# Patient Record
Sex: Female | Born: 2000 | Race: Black or African American | Hispanic: No | Marital: Single | Smoking: Never smoker
Health system: Southern US, Community
[De-identification: ages and names within clinical notes are randomized; demographics above are authoritative.]

## PROBLEM LIST (undated history)

## (undated) DIAGNOSIS — G43909 Migraine, unspecified, not intractable, without status migrainosus: Secondary | ICD-10-CM

## (undated) HISTORY — PX: AXILLARY ABCESS IRRIGATION AND DEBRIDEMENT: SHX1210

---
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
## 2019-06-13 ENCOUNTER — Encounter (HOSPITAL_COMMUNITY): Payer: Self-pay

## 2019-06-13 ENCOUNTER — Emergency Department (HOSPITAL_COMMUNITY)
Admission: EM | Admit: 2019-06-13 | Discharge: 2019-06-13 | Disposition: A | Discharge status: Home / Self Care | Payer: Medicaid Other | Attending: Emergency Medicine | Admitting: Emergency Medicine

## 2019-06-13 ENCOUNTER — Other Ambulatory Visit: Payer: Self-pay

## 2019-06-13 DIAGNOSIS — Z79899 Other long term (current) drug therapy: Secondary | ICD-10-CM | POA: Diagnosis not present

## 2019-06-13 DIAGNOSIS — R1030 Lower abdominal pain, unspecified: Secondary | ICD-10-CM | POA: Diagnosis present

## 2019-06-13 DIAGNOSIS — N898 Other specified noninflammatory disorders of vagina: Secondary | ICD-10-CM | POA: Diagnosis not present

## 2019-06-13 DIAGNOSIS — N739 Female pelvic inflammatory disease, unspecified: Secondary | ICD-10-CM | POA: Diagnosis not present

## 2019-06-13 DIAGNOSIS — N730 Acute parametritis and pelvic cellulitis: Secondary | ICD-10-CM

## 2019-06-13 HISTORY — DX: Migraine, unspecified, not intractable, without status migrainosus: G43.909

## 2019-06-13 LAB — URINALYSIS, ROUTINE W REFLEX MICROSCOPIC
Bilirubin Urine: NEGATIVE
Glucose, UA: NEGATIVE mg/dL
Hgb urine dipstick: NEGATIVE
Ketones, ur: NEGATIVE mg/dL
Nitrite: NEGATIVE
Protein, ur: NEGATIVE mg/dL
Specific Gravity, Urine: 1.011 (ref 1.005–1.030)
pH: 6 (ref 5.0–8.0)

## 2019-06-13 LAB — CBC
HCT: 40.4 % (ref 36.0–46.0)
Hemoglobin: 12.2 g/dL (ref 12.0–15.0)
MCH: 25.8 pg — ABNORMAL LOW (ref 26.0–34.0)
MCHC: 30.2 g/dL (ref 30.0–36.0)
MCV: 85.6 fL (ref 80.0–100.0)
Platelets: 340 10*3/uL (ref 150–400)
RBC: 4.72 MIL/uL (ref 3.87–5.11)
RDW: 14.8 % (ref 11.5–15.5)
WBC: 6.7 10*3/uL (ref 4.0–10.5)
nRBC: 0 % (ref 0.0–0.2)

## 2019-06-13 LAB — WET PREP, GENITAL
Clue Cells Wet Prep HPF POC: NONE SEEN
Sperm: NONE SEEN
Trich, Wet Prep: NONE SEEN
Yeast Wet Prep HPF POC: NONE SEEN

## 2019-06-13 LAB — COMPREHENSIVE METABOLIC PANEL
ALT: 19 U/L (ref 0–44)
AST: 22 U/L (ref 15–41)
Albumin: 4 g/dL (ref 3.5–5.0)
Alkaline Phosphatase: 65 U/L (ref 38–126)
Anion gap: 9 (ref 5–15)
BUN: 10 mg/dL (ref 6–20)
CO2: 23 mmol/L (ref 22–32)
Calcium: 9.4 mg/dL (ref 8.9–10.3)
Chloride: 108 mmol/L (ref 98–111)
Creatinine, Ser: 0.77 mg/dL (ref 0.44–1.00)
GFR calc Af Amer: >60 mL/min (ref >60)
GFR calc non Af Amer: >60 mL/min (ref >60)
Glucose, Bld: 96 mg/dL (ref 70–99)
Potassium: 4.3 mmol/L (ref 3.5–5.1)
Sodium: 140 mmol/L (ref 135–145)
Total Bilirubin: 0.3 mg/dL (ref 0.3–1.2)
Total Protein: 7.9 g/dL (ref 6.5–8.1)

## 2019-06-13 LAB — LIPASE, BLOOD: Lipase: 22 U/L (ref 11–51)

## 2019-06-13 LAB — HIV ANTIBODY (ROUTINE TESTING W REFLEX): HIV Screen 4th Generation wRfx: NONREACTIVE

## 2019-06-13 LAB — I-STAT BETA HCG BLOOD, ED (MC, WL, AP ONLY): I-stat hCG, quantitative: <5 m[IU]/mL (ref <5)

## 2019-06-13 MED ORDER — METRONIDAZOLE 500 MG PO TABS: 500.0000 mg | ORAL_TABLET | Freq: Two times a day (BID) | ORAL | 0 refills | Status: AC

## 2019-06-13 MED ORDER — CEFTRIAXONE SODIUM 1 G IJ SOLR
500.0000 mg | Freq: Once | INTRAMUSCULAR | Status: AC
  Administered 2019-06-13: 500 mg via INTRAMUSCULAR
  Filled 2019-06-13: qty 10

## 2019-06-13 MED ORDER — STERILE WATER FOR INJECTION IJ SOLN
INTRAMUSCULAR | Status: AC
  Administered 2019-06-13: 10 mL
  Filled 2019-06-13: qty 10

## 2019-06-13 MED ORDER — DOXYCYCLINE HYCLATE 100 MG PO CAPS: 100.0000 mg | ORAL_CAPSULE | Freq: Two times a day (BID) | ORAL | 0 refills | Status: DC

## 2019-06-13 MED ORDER — DOXYCYCLINE HYCLATE 100 MG PO TABS
100.0000 mg | ORAL_TABLET | Freq: Once | ORAL | Status: AC
  Administered 2019-06-13: 100 mg via ORAL
  Filled 2019-06-13: qty 1

## 2019-06-13 MED ORDER — SODIUM CHLORIDE 0.9% FLUSH
3.0000 mL | Freq: Once | INTRAVENOUS | Status: AC
  Administered 2019-06-13: 3 mL via INTRAVENOUS

## 2019-06-13 MED ORDER — METRONIDAZOLE 500 MG PO TABS
500.0000 mg | ORAL_TABLET | Freq: Once | ORAL | Status: AC
  Administered 2019-06-13: 500 mg via ORAL
  Filled 2019-06-13: qty 1

## 2019-06-13 NOTE — ED Notes (Signed)
An After Visit Summary was printed and given to the patient. Discharge instructions given and no further questions at this time.  

## 2019-06-13 NOTE — ED Triage Notes (Signed)
Patient c/o RLQ abdominal pain and vaginal discharge x 2 weeks. Patient denies any N/v/d.

## 2019-06-13 NOTE — ED Provider Notes (Signed)
[facility]-EMERGENCY DEPT Provider Note   CSN: [id number] Arrival date & time: 06/13/19  1140     History Chief Complaint  Patient presents with  . Abdominal Pain    Katherine Cross is a 19 y.o. female with no significant past medical history who presents for evaluation of abdominal pain and vaginal discharge.  Patient with diffuse lower abdominal pain. Intermittent LLQ pain however none currently. Has noticed thin, copious, white vaginal discharge.  Intermittent vaginal pruritus however none currently.  She is sexually active and does not use protection.  She is unsure if she could be pregnant.  She is finished her menstrual cycle.  Denies fever, chills, nausea, vomiting, chest pain, shortness of breath, dysuria, diarrhea or constipation.  No prior history of STDs.  Denies additional aggravating or alleviating factors.  Has not take anything for pain. Rates her pain a 7/10. Does not want anything for pain at this time.  History obtained from patient and ast medical records.  No interpreter is used.  HPI     Past Medical History:  Diagnosis Date  . Migraine     There are no problems to display for this patient.   Past Surgical History:  Procedure Laterality Date  . AXILLARY ABCESS IRRIGATION AND DEBRIDEMENT       OB History   No obstetric history on file.     Family History  Problem Relation Age of Onset  . Diabetes Father     Social History   Tobacco Use  . Smoking status: Never Smoker  . Smokeless tobacco: Never Used  Substance Use Topics  . Alcohol use: Yes  . Drug use: Not Currently    Home Medications Prior to Admission medications   Medication Sig Start Date End Date Taking? Authorizing Provider  acetaminophen (TYLENOL) 500 MG tablet Take 1,000 mg by mouth every 6 (six) hours as needed for mild pain or headache.   Yes [provider]  ibuprofen (ADVIL,MOTRIN) 200 MG tablet Take 800 mg by mouth every 6 (six) hours as needed  for headache or moderate pain.   Yes [provider]  JUNEL FE 1/20 1-20 MG-MCG tablet Take 1 tablet by mouth daily. 01/10/18  Yes [provider]  doxycycline (VIBRAMYCIN) 100 MG capsule Take 1 capsule (100 mg total) by mouth 2 (two) times daily. 06/13/19   Nichlos Kunzler A, PA-C  guaiFENesin (ROBITUSSIN) 100 MG/5ML liquid Take 5-10 mLs (100-200 mg total) by mouth every 4 (four) hours as needed for cough. Patient not taking: Reported on 06/13/2019 04/30/18   McDonald, Mia A, PA-C  metroNIDAZOLE (FLAGYL) 500 MG tablet Take 1 tablet (500 mg total) by mouth 2 (two) times daily for 10 days. 06/13/19 06/23/19  Deyja Sochacki A, PA-C  promethazine-dextromethorphan (PROMETHAZINE-DM) 6.25-15 MG/5ML syrup Take 5 mLs by mouth 4 (four) times daily as needed for cough. Patient not taking: Reported on 06/13/2019 04/30/18   Frederik Pear A, PA-C    Allergies    Patient has no known allergies.  Review of Systems   Review of Systems  Constitutional: Negative.   HENT: Negative.   Respiratory: Negative.   Cardiovascular: Negative.   Gastrointestinal: Positive for abdominal pain. Negative for anal bleeding, blood in stool, constipation, diarrhea, nausea, rectal pain and vomiting.  Genitourinary: Positive for pelvic pain and vaginal discharge. Negative for decreased urine volume, difficulty urinating, dysuria, flank pain, frequency, genital sores, hematuria, menstrual problem, urgency, vaginal bleeding and vaginal pain.  Neurological: Negative.   All other systems reviewed  and are negative.   Physical Exam Updated Vital Signs BP 130/87   Pulse 73   Temp 99.3 F (37.4 C) (Oral)   Resp 16   Ht 6' (1.829 m)   Wt 131.5 kg   LMP 05/29/2019   SpO2 100%   BMI 39.33 kg/m   Physical Exam Vitals and nursing note reviewed. Exam conducted with a chaperone present.  Constitutional:      General: She is not in acute distress.    Appearance: She is well-developed. She is obese. She is not  ill-appearing, toxic-appearing or diaphoretic.  HENT:     Head: Atraumatic.     Mouth/Throat:     Mouth: Mucous membranes are moist.  Eyes:     Pupils: Pupils are equal, round, and reactive to light.  Cardiovascular:     Rate and Rhythm: Normal rate.     Heart sounds: Normal heart sounds.  Pulmonary:     Effort: Pulmonary effort is normal. No respiratory distress.  Abdominal:     General: Bowel sounds are normal. There is no distension.     Palpations: Abdomen is soft.     Tenderness: There is abdominal tenderness in the right lower quadrant, suprapubic area and left lower quadrant. There is no right CVA tenderness, left CVA tenderness, guarding or rebound. Negative signs include Murphy's sign and McBurney's sign.     Hernia: No hernia is present.     Comments: Soft, generalized tenderness to lower abd, No rebound or guarding.  Genitourinary:    Comments: Normal appearing external female genitalia without rashes or lesions, normal vaginal epithelium. Normal appearing cervix with moderate thin, light yellow discharge. No cervical petechiae. Cervical os is closed. There is no bleeding noted at the os. mild Odor. Bimanual: CMT tenderness. NO adnexal tenderness. No palpable adnexal masses or tenderness. Uterus midline and not fixed. Rectovaginal exam was deferred.  No cystocele or rectocele noted. No pelvic lymphadenopathy noted. Wet prep was obtained.  Cultures for gonorrhea and chlamydia collected. Exam performed with chaperone in room. Musculoskeletal:        General: Normal range of motion.     Cervical back: Normal range of motion.  Skin:    General: Skin is warm and dry.  Neurological:     Mental Status: She is alert.     ED Results / Procedures / Treatments   Labs (all labs ordered are listed, but only abnormal results are displayed) Labs Reviewed  CBC - Abnormal; Notable for the following components:      Result Value   MCH 25.8 (*)    All other components within normal  limits  URINALYSIS, ROUTINE W REFLEX MICROSCOPIC - Abnormal; Notable for the following components:   Color, Urine YELLOW (*)    APPearance HAZY (*)    Leukocytes,Ua LARGE (*)    Bacteria, UA RARE (*)    All other components within normal limits  WET PREP, GENITAL  URINE CULTURE  LIPASE, BLOOD  COMPREHENSIVE METABOLIC PANEL  HIV ANTIBODY (ROUTINE TESTING W REFLEX)  RPR  I-STAT BETA HCG BLOOD, ED ([facility], [facility], [facility] ONLY)  GC/CHLAMYDIA PROBE AMP ([place]) NOT AT [facility]    EKG None  Radiology No results found.  Procedures Procedures (including critical care time)  Medications Ordered in ED Medications  sodium chloride flush (NS) 0.9 % injection 3 mL (3 mLs Intravenous Given 06/13/19 1321)  cefTRIAXone (ROCEPHIN) injection 500 mg (500 mg Intramuscular Given 06/13/19 1456)  doxycycline (VIBRA-TABS) tablet 100 mg (100 mg Oral  Given 06/13/19 1456)  metroNIDAZOLE (FLAGYL) tablet 500 mg (500 mg Oral Given 06/13/19 1456)  sterile water (preservative free) injection (10 mLs  Given 06/13/19 1456)    ED Course  I have reviewed the triage vital signs and the nursing notes.  Pertinent labs & imaging results that were available during my care of the patient were reviewed by me and considered in my medical decision making (see chart for details).  19 year old female appears otherwise well presents for evaluation of lower abdominal pain vaginal discharge.  No prior STDs.  Diffuse lower abdominal pain. Heart and lungs clear.  She is afebrile, nonseptic, non-ill-appearing. Plan for labs, imaging, pelvic exam and reassess.  Labs and imaging personally reviewed and interpreted.  No leukocytosis.  GU exam with moderate discharge in vault.  There is cervical motion tenderness however no adnexal tenderness.  Low suspicion for torsion, TOA.  High clinical suspicion for PID.  Will treat as such.  Patient would like to hold on imaging at this time which I feel is reasonable.  She does not appear septic.  Triage  note had noted right lower quadrant pain however patient denies this.  Low suspicion for appendicitis.  Patient is nontoxic, nonseptic appearing, in no apparent distress.  Patient's pain and other symptoms adequately managed in emergency department.  Fluid bolus given.  Labs, imaging and vitals reviewed.  Patient does not meet the SIRS or Sepsis criteria.  On repeat exam patient does not have a surgical abdomin and there are no peritoneal signs.  No indication of appendicitis, bowel obstruction, bowel perforation, cholecystitis, diverticulitis, TOA, torsion or ectopic pregnancy.  Patient discharged home with symptomatic treatment and given strict instructions for follow-up with their primary care physician.  I have also discussed reasons to return immediately to the ER.  Patient expresses understanding and agrees with plan.    MDM Rules/Calculators/A&P                       Final Clinical Impression(s) / ED Diagnoses Final diagnoses:  PID (acute pelvic inflammatory disease)    Rx / DC Orders ED Discharge Orders         Ordered    doxycycline (VIBRAMYCIN) 100 MG capsule  2 times daily     06/13/19 1440    metroNIDAZOLE (FLAGYL) 500 MG tablet  2 times daily     06/13/19 1440           Sagan Maselli A, PA-C 06/13/19 1457    Pattricia Boss, MD 06/14/19 1137

## 2019-06-13 NOTE — Discharge Instructions (Signed)
Take the antibiotics as prescribed. Return for new or worsening symptoms. °

## 2019-06-14 LAB — URINE CULTURE

## 2019-06-14 LAB — RPR: RPR Ser Ql: NONREACTIVE

## 2019-06-15 LAB — GC/CHLAMYDIA PROBE AMP (~~LOC~~) NOT AT ARMC
Chlamydia: POSITIVE — AB
Neisseria Gonorrhea: NEGATIVE

## 2019-07-04 ENCOUNTER — Emergency Department (HOSPITAL_COMMUNITY)
Admission: EM | Admit: 2019-07-04 | Discharge: 2019-07-04 | Disposition: A | Discharge status: Home / Self Care | Payer: Medicaid Other | Attending: Emergency Medicine | Admitting: Emergency Medicine

## 2019-07-04 ENCOUNTER — Emergency Department (HOSPITAL_COMMUNITY): Payer: Medicaid Other

## 2019-07-04 ENCOUNTER — Encounter (HOSPITAL_COMMUNITY): Payer: Self-pay

## 2019-07-04 ENCOUNTER — Other Ambulatory Visit: Payer: Self-pay

## 2019-07-04 DIAGNOSIS — N898 Other specified noninflammatory disorders of vagina: Secondary | ICD-10-CM | POA: Diagnosis present

## 2019-07-04 DIAGNOSIS — N730 Acute parametritis and pelvic cellulitis: Secondary | ICD-10-CM

## 2019-07-04 DIAGNOSIS — N739 Female pelvic inflammatory disease, unspecified: Secondary | ICD-10-CM | POA: Insufficient documentation

## 2019-07-04 DIAGNOSIS — Z79899 Other long term (current) drug therapy: Secondary | ICD-10-CM | POA: Insufficient documentation

## 2019-07-04 LAB — COMPREHENSIVE METABOLIC PANEL
ALT: 22 U/L (ref 0–44)
AST: 23 U/L (ref 15–41)
Albumin: 4 g/dL (ref 3.5–5.0)
Alkaline Phosphatase: 58 U/L (ref 38–126)
Anion gap: 11 (ref 5–15)
BUN: 9 mg/dL (ref 6–20)
CO2: 22 mmol/L (ref 22–32)
Calcium: 9.2 mg/dL (ref 8.9–10.3)
Chloride: 109 mmol/L (ref 98–111)
Creatinine, Ser: 0.86 mg/dL (ref 0.44–1.00)
GFR calc Af Amer: >60 mL/min (ref >60)
GFR calc non Af Amer: >60 mL/min (ref >60)
Glucose, Bld: 114 mg/dL — ABNORMAL HIGH (ref 70–99)
Potassium: 3.9 mmol/L (ref 3.5–5.1)
Sodium: 142 mmol/L (ref 135–145)
Total Bilirubin: 0.4 mg/dL (ref 0.3–1.2)
Total Protein: 8.2 g/dL — ABNORMAL HIGH (ref 6.5–8.1)

## 2019-07-04 LAB — I-STAT BETA HCG BLOOD, ED (MC, WL, AP ONLY): I-stat hCG, quantitative: <5 m[IU]/mL (ref <5)

## 2019-07-04 LAB — CBC WITH DIFFERENTIAL/PLATELET
Abs Immature Granulocytes: 0.01 10*3/uL (ref 0.00–0.07)
Basophils Absolute: 0.1 10*3/uL (ref 0.0–0.1)
Basophils Relative: 1 %
Eosinophils Absolute: 0.1 10*3/uL (ref 0.0–0.5)
Eosinophils Relative: 2 %
HCT: 41.5 % (ref 36.0–46.0)
Hemoglobin: 12.8 g/dL (ref 12.0–15.0)
Immature Granulocytes: 0 %
Lymphocytes Relative: 28 %
Lymphs Abs: 1.5 10*3/uL (ref 0.7–4.0)
MCH: 26 pg (ref 26.0–34.0)
MCHC: 30.8 g/dL (ref 30.0–36.0)
MCV: 84.2 fL (ref 80.0–100.0)
Monocytes Absolute: 0.2 10*3/uL (ref 0.1–1.0)
Monocytes Relative: 4 %
Neutro Abs: 3.5 10*3/uL (ref 1.7–7.7)
Neutrophils Relative %: 65 %
Platelets: 328 10*3/uL (ref 150–400)
RBC: 4.93 MIL/uL (ref 3.87–5.11)
RDW: 15.3 % (ref 11.5–15.5)
WBC: 5.4 10*3/uL (ref 4.0–10.5)
nRBC: 0 % (ref 0.0–0.2)

## 2019-07-04 LAB — URINALYSIS, ROUTINE W REFLEX MICROSCOPIC
Bilirubin Urine: NEGATIVE
Glucose, UA: NEGATIVE mg/dL
Ketones, ur: NEGATIVE mg/dL
Nitrite: NEGATIVE
Protein, ur: 100 mg/dL — AB
RBC / HPF: >50 RBC/hpf — ABNORMAL HIGH (ref 0–5)
Specific Gravity, Urine: 1.028 (ref 1.005–1.030)
WBC, UA: >50 WBC/hpf — ABNORMAL HIGH (ref 0–5)
pH: 6 (ref 5.0–8.0)

## 2019-07-04 LAB — LIPASE, BLOOD: Lipase: 21 U/L (ref 11–51)

## 2019-07-04 LAB — WET PREP, GENITAL
Clue Cells Wet Prep HPF POC: NONE SEEN
Sperm: NONE SEEN
Trich, Wet Prep: NONE SEEN
Yeast Wet Prep HPF POC: NONE SEEN

## 2019-07-04 LAB — HIV ANTIBODY (ROUTINE TESTING W REFLEX): HIV Screen 4th Generation wRfx: NONREACTIVE

## 2019-07-04 MED ORDER — LIDOCAINE HCL (PF) 1 % IJ SOLN
2.0000 mL | Freq: Once | INTRAMUSCULAR | Status: AC
  Administered 2019-07-04: 2 mL
  Filled 2019-07-04: qty 30

## 2019-07-04 MED ORDER — METRONIDAZOLE 500 MG PO TABS: 500.0000 mg | ORAL_TABLET | Freq: Two times a day (BID) | ORAL | 0 refills | Status: AC

## 2019-07-04 MED ORDER — KETOROLAC TROMETHAMINE 60 MG/2ML IM SOLN
30.0000 mg | Freq: Once | INTRAMUSCULAR | Status: AC
  Administered 2019-07-04: 30 mg via INTRAMUSCULAR
  Filled 2019-07-04: qty 2

## 2019-07-04 MED ORDER — CEFTRIAXONE SODIUM 1 G IJ SOLR
500.0000 mg | Freq: Once | INTRAMUSCULAR | Status: AC
  Administered 2019-07-04: 500 mg via INTRAMUSCULAR
  Filled 2019-07-04: qty 10

## 2019-07-04 MED ORDER — IOHEXOL 300 MG/ML  SOLN
100.0000 mL | Freq: Once | INTRAMUSCULAR | Status: AC | PRN
  Administered 2019-07-04: 100 mL via INTRAVENOUS

## 2019-07-04 MED ORDER — SODIUM CHLORIDE (PF) 0.9 % IJ SOLN
INTRAMUSCULAR | Status: AC
  Filled 2019-07-04: qty 50

## 2019-07-04 MED ORDER — DOXYCYCLINE HYCLATE 100 MG PO CAPS: 100.0000 mg | ORAL_CAPSULE | Freq: Two times a day (BID) | ORAL | 0 refills | Status: AC

## 2019-07-04 NOTE — ED Notes (Signed)
Pt ambulatory to bathroom, no assistance needed.  

## 2019-07-04 NOTE — Discharge Instructions (Addendum)
[facility] will be contacting you to schedule an appointment to be seen either this Wednesday or Thursday. If you have not heard from them by Thursday I would recommend calling to schedule an appointment for your pelvic inflammatory disease.   Pick up medications and take as prescribed. It is very important to take the entire course of the medication. Do not have intercourse while you are on this medication. Do not drink alcohol while on this medication.   You may take Ibuprofen and Tylenol as needed for pain.   Return to the ED IMMEDIATELY for any worsening symptoms including fevers > 100.4/chills, worsening pain, vomiting, passing out, or any other concerning symptoms.

## 2019-07-04 NOTE — ED Provider Notes (Signed)
[facility]-EMERGENCY DEPT Provider Note   CSN: [id number] Arrival date & time: 07/04/19  1027     History Chief Complaint  Patient presents with  . Vaginal Discharge    Katherine Cross is a 19 y.o. female who presents to the ED today with complaint of return of vaginal discharge and odor x 4-5 days. Pt also complains of abdominal pain. Per chart review pt was seen in the ED on 03/15 for similar complaint and diagnosed with PID. She declined imaging at that time and there was less concern for TOA vs torsion. Pt discharged home with doxycycline and flagyl. She reports taking all of her medications and denies having intercourse since then. She states that the vaginal discharge improved however returned 4 days ago. Pt reports this feel similar to last time. She is about to start her menses today. She has not taken anything for pain. Denies fevers, chills, nausea, vomiting, constipation, flank pain, urinary sx.   The history is provided by the patient and medical records.       Past Medical History:  Diagnosis Date  . Migraine     There are no problems to display for this patient.   Past Surgical History:  Procedure Laterality Date  . AXILLARY ABCESS IRRIGATION AND DEBRIDEMENT       OB History   No obstetric history on file.     Family History  Problem Relation Age of Onset  . Diabetes Father     Social History   Tobacco Use  . Smoking status: Never Smoker  . Smokeless tobacco: Never Used  Substance Use Topics  . Alcohol use: Yes  . Drug use: Not Currently    Home Medications Prior to Admission medications   Medication Sig Start Date End Date Taking? Authorizing Provider  acetaminophen (TYLENOL) 500 MG tablet Take 1,000 mg by mouth every 6 (six) hours as needed for mild pain or headache.   Yes [provider]  ibuprofen (ADVIL,MOTRIN) 200 MG tablet Take 800 mg by mouth every 6 (six) hours as needed for headache or moderate pain.   Yes  [provider]  JUNEL FE 1/20 1-20 MG-MCG tablet Take 1 tablet by mouth daily. 01/10/18  Yes [provider]  doxycycline (VIBRAMYCIN) 100 MG capsule Take 1 capsule (100 mg total) by mouth 2 (two) times daily for 14 days. 07/04/19 07/18/19  Tanda RockersVenter, Delois Silvester, PA-C  guaiFENesin (ROBITUSSIN) 100 MG/5ML liquid Take 5-10 mLs (100-200 mg total) by mouth every 4 (four) hours as needed for cough. Patient not taking: Reported on 06/13/2019 04/30/18   McDonald, Mia A, PA-C  metroNIDAZOLE (FLAGYL) 500 MG tablet Take 1 tablet (500 mg total) by mouth 2 (two) times daily for 14 days. 07/04/19 07/18/19  Tanda RockersVenter, Dimond Crotty, PA-C  promethazine-dextromethorphan (PROMETHAZINE-DM) 6.25-15 MG/5ML syrup Take 5 mLs by mouth 4 (four) times daily as needed for cough. Patient not taking: Reported on 06/13/2019 04/30/18   Frederik PearMcDonald, Mia A, PA-C    Allergies    Patient has no known allergies.  Review of Systems   Review of Systems  Constitutional: Negative for chills and fever.  Gastrointestinal: Positive for abdominal pain. Negative for constipation, nausea and vomiting.  Genitourinary: Positive for pelvic pain and vaginal discharge. Negative for dysuria, flank pain, frequency and menstrual problem.  All other systems reviewed and are negative.   Physical Exam Updated Vital Signs BP (!) 137/116 (BP Location: Left Arm)   Pulse 89   Temp 98.5 F (36.9 C) (Oral)  Resp 16   LMP 06/04/2019 (Approximate)   SpO2 94%   Physical Exam Vitals and nursing note reviewed.  Constitutional:      Appearance: She is obese. She is not ill-appearing or diaphoretic.  HENT:     Head: Normocephalic and atraumatic.  Eyes:     Conjunctiva/sclera: Conjunctivae normal.  Cardiovascular:     Rate and Rhythm: Normal rate and regular rhythm.     Pulses: Normal pulses.  Pulmonary:     Effort: Pulmonary effort is normal.     Breath sounds: Normal breath sounds. No wheezing, rhonchi or rales.  Abdominal:     Palpations:  Abdomen is soft.     Tenderness: There is abdominal tenderness. There is no right CVA tenderness, left CVA tenderness, guarding or rebound.     Comments: Soft, + diffuse abdominal TTP however > LUQ, +BS throughout, no r/g/r, neg murphy's, neg mcburney's, no CVA TTP  Genitourinary:    Comments: Chaperone present for exam.  Exam very limited due to body habitus as well as excessive vaginal TTP. Small amount of blood in vault as well as thin yellow discharge. + exquisite TTP to vaginal area upon inserting speculum even 0.5 cm. Musculoskeletal:     Cervical back: Neck supple.  Skin:    General: Skin is warm and dry.  Neurological:     Mental Status: She is alert.     ED Results / Procedures / Treatments   Labs (all labs ordered are listed, but only abnormal results are displayed) Labs Reviewed  WET PREP, GENITAL - Abnormal; Notable for the following components:      Result Value   WBC, Wet Prep HPF POC MANY (*)    All other components within normal limits  URINALYSIS, ROUTINE W REFLEX MICROSCOPIC - Abnormal; Notable for the following components:   Color, Urine AMBER (*)    APPearance HAZY (*)    Hgb urine dipstick LARGE (*)    Protein, ur 100 (*)    Leukocytes,Ua SMALL (*)    RBC / HPF >50 (*)    WBC, UA >50 (*)    Bacteria, UA FEW (*)    All other components within normal limits  COMPREHENSIVE METABOLIC PANEL - Abnormal; Notable for the following components:   Glucose, Bld 114 (*)    Total Protein 8.2 (*)    All other components within normal limits  URINE CULTURE  HIV ANTIBODY (ROUTINE TESTING W REFLEX)  LIPASE, BLOOD  CBC WITH DIFFERENTIAL/PLATELET  RPR  I-STAT BETA HCG BLOOD, ED ([facility], [facility], [facility] ONLY)  GC/CHLAMYDIA PROBE AMP ([facility]) NOT AT [facility]    EKG None  Radiology US Transvaginal Non-OB  Result Date: 07/04/2019 CLINICAL DATA:  Pelvic pain and cramping with vaginal discharge EXAM: TRANSABDOMINAL AND TRANSVAGINAL ULTRASOUND OF PELVIS DOPPLER ULTRASOUND OF OVARIES  TECHNIQUE: Study was performed transabdominally to optimize pelvic field of view evaluation and transvaginally to optimize internal visceral architecture evaluation. Color and duplex Doppler ultrasound was utilized to evaluate blood flow to the ovaries. COMPARISON:  None. FINDINGS: Uterus Measurements: 7.3 x 3.8 x 4.2 cm = volume: 61.2 mL. No fibroids or other mass visualized. Endometrium Thickness: 4 mm.  No focal abnormality visualized. Right ovary Measurements: 3.2 x 2.3 x 2.6 cm = volume: 10.2 mL. Normal appearance/no adnexal mass. Left ovary Measurements: 2.9 x 2.0 x 1.9 cm = volume: 5.6 mL. Normal appearance/no adnexal mass. Pulsed Doppler evaluation of both ovaries demonstrates normal low-resistance arterial and venous waveforms. Other findings Trace fluid in  cul-de-sac. IMPRESSION: Patient is tender over the right ovary with transvaginal technique. Significance of this tenderness is uncertain. No ovarian/adnexal mass evident on either side. Low resistance waveform in each ovary. No demonstrable ovarian torsion. Uterus and endometrium appear unremarkable. Trace free pelvic fluid may be physiologic. Electronically Signed   By: Bretta Bang III M.D.   On: 07/04/2019 13:03   US Pelvis Complete  Result Date: 07/04/2019 CLINICAL DATA:  Pelvic pain and cramping with vaginal discharge EXAM: TRANSABDOMINAL AND TRANSVAGINAL ULTRASOUND OF PELVIS DOPPLER ULTRASOUND OF OVARIES TECHNIQUE: Study was performed transabdominally to optimize pelvic field of view evaluation and transvaginally to optimize internal visceral architecture evaluation. Color and duplex Doppler ultrasound was utilized to evaluate blood flow to the ovaries. COMPARISON:  None. FINDINGS: Uterus Measurements: 7.3 x 3.8 x 4.2 cm = volume: 61.2 mL. No fibroids or other mass visualized. Endometrium Thickness: 4 mm.  No focal abnormality visualized. Right ovary Measurements: 3.2 x 2.3 x 2.6 cm = volume: 10.2 mL. Normal appearance/no adnexal mass. Left  ovary Measurements: 2.9 x 2.0 x 1.9 cm = volume: 5.6 mL. Normal appearance/no adnexal mass. Pulsed Doppler evaluation of both ovaries demonstrates normal low-resistance arterial and venous waveforms. Other findings Trace fluid in cul-de-sac. IMPRESSION: Patient is tender over the right ovary with transvaginal technique. Significance of this tenderness is uncertain. No ovarian/adnexal mass evident on either side. Low resistance waveform in each ovary. No demonstrable ovarian torsion. Uterus and endometrium appear unremarkable. Trace free pelvic fluid may be physiologic. Electronically Signed   By: Bretta Bang III M.D.   On: 07/04/2019 13:03   CT Abdomen Pelvis W Contrast  Result Date: 07/04/2019 CLINICAL DATA:  Vaginal discharge, lower abdominal pain, recently treated for PID EXAM: CT ABDOMEN AND PELVIS WITH CONTRAST TECHNIQUE: Multidetector CT imaging of the abdomen and pelvis was performed using the standard protocol following bolus administration of intravenous contrast. CONTRAST:  OMNIPAQUE IOHEXOL 300 MG/ML  SOLN COMPARISON:  Pelvic ultrasound, 07/04/2019 FINDINGS: Lower chest: No acute abnormality. Hepatobiliary: No solid liver abnormality is seen. No gallstones, gallbladder wall thickening, or biliary dilatation. Pancreas: Unremarkable. No pancreatic ductal dilatation or surrounding inflammatory changes. Spleen: Normal in size without significant abnormality. Adrenals/Urinary Tract: Adrenal glands are unremarkable. Kidneys are normal, without renal calculi, solid lesion, or hydronephrosis. Bladder is unremarkable. Stomach/Bowel: Stomach is within normal limits. Appendix appears normal. No evidence of bowel wall thickening, distention, or inflammatory changes. Vascular/Lymphatic: No significant vascular findings are present. No enlarged abdominal or pelvic lymph nodes. Reproductive: No mass or other significant abnormality. Other: No abdominal wall hernia or abnormality. Trace ascites in the low  pelvis. Musculoskeletal: No acute or significant osseous findings. IMPRESSION: Nonspecific trace ascites in the low pelvis, as seen on same-day ultrasound, and may be functional or reactive. No specific findings of the abdomen or pelvis to explain lower abdominal pain. Electronically Signed   By: Lauralyn Primes M.D.   On: 07/04/2019 14:42   [place] Art/Ven Flow Abd Pelv Doppler  Result Date: 07/04/2019 CLINICAL DATA:  Pelvic pain and cramping with vaginal discharge EXAM: TRANSABDOMINAL AND TRANSVAGINAL ULTRASOUND OF PELVIS DOPPLER ULTRASOUND OF OVARIES TECHNIQUE: Study was performed transabdominally to optimize pelvic field of view evaluation and transvaginally to optimize internal visceral architecture evaluation. Color and duplex Doppler ultrasound was utilized to evaluate blood flow to the ovaries. COMPARISON:  None. FINDINGS: Uterus Measurements: 7.3 x 3.8 x 4.2 cm = volume: 61.2 mL. No fibroids or other mass visualized. Endometrium Thickness: 4 mm.  No focal abnormality visualized. Right  ovary Measurements: 3.2 x 2.3 x 2.6 cm = volume: 10.2 mL. Normal appearance/no adnexal mass. Left ovary Measurements: 2.9 x 2.0 x 1.9 cm = volume: 5.6 mL. Normal appearance/no adnexal mass. Pulsed Doppler evaluation of both ovaries demonstrates normal low-resistance arterial and venous waveforms. Other findings Trace fluid in cul-de-sac. IMPRESSION: Patient is tender over the right ovary with transvaginal technique. Significance of this tenderness is uncertain. No ovarian/adnexal mass evident on either side. Low resistance waveform in each ovary. No demonstrable ovarian torsion. Uterus and endometrium appear unremarkable. Trace free pelvic fluid may be physiologic. Electronically Signed   By: Bretta Bang III M.D.   On: 07/04/2019 13:03    Procedures Procedures (including critical care time)  Medications Ordered in ED Medications  sodium chloride (PF) 0.9 % injection (has no administration in time range)  cefTRIAXone  (ROCEPHIN) injection 500 mg (has no administration in time range)  ketorolac (TORADOL) injection 30 mg (has no administration in time range)  iohexol (OMNIPAQUE) 300 MG/ML solution 100 mL (100 mLs Intravenous Contrast Given 07/04/19 1418)    ED Course  I have reviewed the triage vital signs and the nursing notes.  Pertinent labs & imaging results that were available during my care of the patient were reviewed by me and considered in my medical decision making (see chart for details).    MDM Rules/Calculators/A&P                      19 year old female who presents to the ED today complaining of vaginal discharge and abdominal pain for the past 4 to 5 days with vaginal discharge.  Recently treated for PID on 3/15 and endorses that she finished all of her medications.  States pain and symptoms resolved with abx however returned again. She was given Rocephin in the ED and discharged home with Flagyl and doxycycline.  She denies having any intercourse since then.  She states her symptoms feel similar today.  She is been taking ibuprofen with mild relief.  On arrival to the ED pt is afebrile, nontachycardic, and nontachypneic.  She has diffuse abdominal tenderness however it appears worse in the left upper quadrant.  She denies any heavy alcohol use.  Labs reviewed from last visit and no elevation in LFTs to suggest pancreatitis.  Obtain screening labs at this time and perform pelvic exam.  Patient did not have any imaging last time, will obtain pelvic ultrasound today.   Per chart review pt did test positive for chlamydia on 3/15. No signs of gonorrhea.   Unable to tolerate full pelvic exam due to pain however does appear to have thin yellow discharge in vault. Concern for PID again today. Awaiting labs and imaging.   CBC without leukocytosis. Hgb stable.  CMP with glucose 114. No other electrolyte abnormalities.  Lipase 21.  U/A with > 50 RBCs; pt is currently starting her menses. > 50 WBCs. Pt  without urinary sx today. She did have 21-50 WBCs in urine on 3/15 however culture grew nonspecific bacteria. Will obtain urine culture again today.  Beta hcg negative.   Pelvic ultrasound without acute findings. Given pts tenderness will obtain CT A/P today to rule out intraabdominal etiology.   CT scan essentially negative besides small amount of free fluid in the pelvis seen on ultrasound. Will consult OBGYN for further recommendations.   Discussed case with OBGYN Dr. Vergie Living who recommends retreating for PID today. Recommends toradol in the ED for pain control. Will have the office call  pt to schedule an outpatient follow up. 500 Rocephin given in the ED today and discharged home with flagyl and doxy. Strict return precautions discussed. Pt is in agreement with plan and stable for discharge home.   This note was prepared using Dragon voice recognition software and may include unintentional dictation errors due to the inherent limitations of voice recognition software.   Final Clinical Impression(s) / ED Diagnoses Final diagnoses:  PID (acute pelvic inflammatory disease)    Rx / DC Orders ED Discharge Orders         Ordered    doxycycline (VIBRAMYCIN) 100 MG capsule  2 times daily     07/04/19 1513    metroNIDAZOLE (FLAGYL) 500 MG tablet  2 times daily     07/04/19 1513           Discharge Instructions     [facility] will be contacting you to schedule an appointment to be seen either this Wednesday or Thursday. If you have not heard from them by Thursday I would recommend calling to schedule an appointment for your pelvic inflammatory disease.   Pick up medications and take as prescribed. It is very important to take the entire course of the medication. Do not have intercourse while you are on this medication. Do not drink alcohol while on this medication.   You may take Ibuprofen and Tylenol as needed for pain.   Return to the ED IMMEDIATELY for any  worsening symptoms including fevers > 100.4/chills, worsening pain, vomiting, passing out, or any other concerning symptoms.        Eustaquio Maize, PA-C 07/04/19 1527    Lacretia Leigh, MD 07/05/19 [id number]

## 2019-07-04 NOTE — ED Triage Notes (Signed)
Pt presents with c/o vaginal discharge. Pt reports that she was recently diagnosed with PID and does not believe that is has resolved. Pt reports vaginal discharge, odor, and some lower abdominal pain.

## 2019-07-05 LAB — URINE CULTURE: Culture: 10000 — AB

## 2019-07-05 LAB — GC/CHLAMYDIA PROBE AMP (~~LOC~~) NOT AT ARMC
Chlamydia: NEGATIVE
Comment: NEGATIVE
Comment: NORMAL
Neisseria Gonorrhea: NEGATIVE

## 2019-07-05 LAB — RPR: RPR Ser Ql: NONREACTIVE

## 2019-07-06 ENCOUNTER — Encounter: Payer: Self-pay | Admitting: Family Medicine

## 2019-07-06 ENCOUNTER — Ambulatory Visit (INDEPENDENT_AMBULATORY_CARE_PROVIDER_SITE_OTHER): Payer: Medicaid Other | Admitting: Family Medicine

## 2019-07-06 ENCOUNTER — Other Ambulatory Visit: Payer: Self-pay

## 2019-07-06 VITALS — BP 109/79 | HR 75 | Wt 314.4 lb

## 2019-07-06 DIAGNOSIS — Z8619 Personal history of other infectious and parasitic diseases: Secondary | ICD-10-CM | POA: Diagnosis not present

## 2019-07-06 DIAGNOSIS — N739 Female pelvic inflammatory disease, unspecified: Secondary | ICD-10-CM

## 2019-07-06 NOTE — Patient Instructions (Signed)
Hormonal Contraception Information Hormonal contraception is a type of birth control that uses hormones to prevent pregnancy. It usually involves a combination of the hormones estrogen and progesterone or only the hormone progesterone. Hormonal contraception works in these ways:  It thickens the mucus in the cervix, making it harder for sperm to enter the uterus.  It changes the lining of the uterus, making it harder for an egg to implant.  It may stop the ovaries from releasing eggs (ovulation). Some women who take hormonal contraceptives that contain only progesterone may continue to ovulate. Hormonal contraception cannot prevent sexually transmitted infections (STIs). Pregnancy may still occur. Estrogen and progesterone contraceptives Contraceptives that use a combination of estrogen and progesterone are available in these forms:  Pill. Pills come in different combinations of hormones. They must be taken at the same time each day. Pills can affect your period, causing you to get your period once every three months or not at all.  Patch. The patch must be worn on the lower abdomen for three weeks and then removed on the fourth.  Vaginal ring. The ring is placed in the vagina and left there for three weeks. It is then removed for one week. Progesterone contraceptives Contraceptives that use progesterone only are available in these forms:  Pill. Pills should be taken every day of the cycle.  Intrauterine device (IUD). This device is inserted into the uterus and removed or replaced every five years or sooner.  Implant. Plastic rods are placed under the skin of the upper arm. They are removed or replaced every three years or sooner.  Injection. The injection is given once every 90 days. What are the side effects? The side effects of estrogen and progesterone contraceptives include:  Nausea.  Headaches.  Breast tenderness.  Bleeding or spotting between menstrual cycles.  High blood  pressure (rare).  Strokes, heart attacks, or blood clots (rare) Side effects of progesterone-only contraceptives include:  Nausea.  Headaches.  Breast tenderness.  Unpredictable menstrual bleeding.  High blood pressure (rare). Talk to your health care provider about what side effects may affect you. Where to find more information  Ask your health care provider for more information and resources about hormonal contraception.  [place] Department of Health and Human Services Office on Women's Health: www.womenshealth.gov Questions to ask:  What type of hormonal contraception is right for me?  How long should I plan to use hormonal contraception?  What are the side effects of the hormonal contraception method I choose?  How can I prevent STIs while using hormonal contraception? Contact a health care provider if:  You start taking hormonal contraceptives and you develop persistent or severe side effects. Summary  Estrogen and progesterone are hormones used in many forms of birth control.  Talk to your health care provider about what side effects may affect you.  Hormonal contraception cannot prevent sexually transmitted infections (STIs).  Ask your health care provider for more information and resources about hormonal contraception. This information is not intended to replace advice given to you by your health care provider. Make sure you discuss any questions you have with your health care provider. Document Revised: 07/12/2018 Document Reviewed: 02/15/2016 Elsevier Patient Education  2020 [employer]  

## 2019-07-06 NOTE — Progress Notes (Signed)
GYNECOLOGY OFFICE NOTE  History:  19 y.o. G0P0000 here today for follow up of PID.  She originally presented to the [facility] on 3/15 with complaints of 4-5 days of vaginal discharge and odor with abdominal pain. She tested positive for Chlamydia. She was given Doxy and Flagyl, reportedly finished this medication and abstained from intercourse. At the end of March, her vaginal discharge and other symptoms returned and she presented to the ED on 07/04/19. She was given Rocephin, and sent home on another course of Doxy and Flagyl.   CT abdomen and pelvis: Nonspecific trace ascites in the low pelvis, as seen on same-day ultrasound, and may be functional or reactive. No specific findings of the abdomen or pelvis to explain lower abdominal pain.  Pelvic US:  Patient is tender over the right ovary with transvaginal technique. Significance of this tenderness is uncertain. No ovarian/adnexal mass evident on either side. Low resistance waveform in each ovary. No demonstrable ovarian torsion. Uterus and endometrium appear unremarkable. Trace free pelvic fluid may be physiologic.  Today she is feeling much better. Denies fevers, chills, nausea, vomiting, constipation, flank pain, or dysuria. Her abdominal pain has resolved- all she has is cramping like her normal period. She is on Day#3 of her cycle, which typically lasts 5-7 days.  She is interested in learning about birth control options at this time.  Past Medical History:  Diagnosis Date  . Migraine     Past Surgical History:  Procedure Laterality Date  . AXILLARY ABCESS IRRIGATION AND DEBRIDEMENT       Current Outpatient Medications:  .  acetaminophen (TYLENOL) 500 MG tablet, Take 1,000 mg by mouth every 6 (six) hours as needed for mild pain or headache., Disp: , Rfl:  .  doxycycline (VIBRAMYCIN) 100 MG capsule, Take 1 capsule (100 mg total) by mouth 2 (two) times daily for 14 days., Disp: 28 capsule, Rfl: 0 .  ibuprofen (ADVIL,MOTRIN) 200 MG  tablet, Take 800 mg by mouth every 6 (six) hours as needed for headache or moderate pain., Disp: , Rfl:  .  metroNIDAZOLE (FLAGYL) 500 MG tablet, Take 1 tablet (500 mg total) by mouth 2 (two) times daily for 14 days., Disp: 28 tablet, Rfl: 0 .  Multiple Vitamins-Minerals (WOMENS MULTIVITAMIN PO), Take by mouth., Disp: , Rfl:  .  JUNEL FE 1/20 1-20 MG-MCG tablet, Take 1 tablet by mouth daily., Disp: , Rfl: 2 .  rizatriptan (MAXALT) 10 MG tablet, Take 10 mg by mouth as needed for migraine. May repeat in 2 hours if needed, Disp: , Rfl:   The following portions of the patient's history were reviewed and updated as appropriate: allergies, current medications, past family history, past medical history, past social history, past surgical history and problem list.   Review of Systems:  Pertinent items noted in HPI and remainder of comprehensive ROS otherwise negative.   Objective:  Physical Exam BP 109/79   Pulse 75   Wt (!) 314 lb 6.4 oz (142.6 kg)   LMP 07/04/2019 (Exact Date)   BMI 42.64 kg/m  CONSTITUTIONAL: Well-developed, well-nourished female in no acute distress.  HENT:  Normocephalic, atraumatic. External right and left ear normal. Oropharynx is clear and moist EYES: Conjunctivae and EOM are normal. Pupils are equal, round, and reactive to light. No scleral icterus.  NECK: Normal range of motion, supple, no masses SKIN: Skin is warm and dry. No rash noted. Not diaphoretic. No erythema. No pallor. NEUROLOGIC: Alert and oriented to person, place, and time. Normal reflexes,  muscle tone coordination. No cranial nerve deficit noted. PSYCHIATRIC: Normal mood and affect. Normal behavior. Normal judgment and thought content. CARDIOVASCULAR: Normal heart rate noted RESPIRATORY: Effort and breath sounds normal, no problems with respiration noted ABDOMEN: Soft, no distention noted.  No tenderness to palpation. PELVIC: deferred as she is on her menses MUSCULOSKELETAL: Normal range of motion. No  edema noted.  Labs and Imaging US Transvaginal Non-OB  Result Date: 07/04/2019 CLINICAL DATA:  Pelvic pain and cramping with vaginal discharge EXAM: TRANSABDOMINAL AND TRANSVAGINAL ULTRASOUND OF PELVIS DOPPLER ULTRASOUND OF OVARIES TECHNIQUE: Study was performed transabdominally to optimize pelvic field of view evaluation and transvaginally to optimize internal visceral architecture evaluation. Color and duplex Doppler ultrasound was utilized to evaluate blood flow to the ovaries. COMPARISON:  None. FINDINGS: Uterus Measurements: 7.3 x 3.8 x 4.2 cm = volume: 61.2 mL. No fibroids or other mass visualized. Endometrium Thickness: 4 mm.  No focal abnormality visualized. Right ovary Measurements: 3.2 x 2.3 x 2.6 cm = volume: 10.2 mL. Normal appearance/no adnexal mass. Left ovary Measurements: 2.9 x 2.0 x 1.9 cm = volume: 5.6 mL. Normal appearance/no adnexal mass. Pulsed Doppler evaluation of both ovaries demonstrates normal low-resistance arterial and venous waveforms. Other findings Trace fluid in cul-de-sac. IMPRESSION: Patient is tender over the right ovary with transvaginal technique. Significance of this tenderness is uncertain. No ovarian/adnexal mass evident on either side. Low resistance waveform in each ovary. No demonstrable ovarian torsion. Uterus and endometrium appear unremarkable. Trace free pelvic fluid may be physiologic. Electronically Signed   By: Lowella Grip III M.D.   On: 07/04/2019 13:03   US Pelvis Complete  Result Date: 07/04/2019 CLINICAL DATA:  Pelvic pain and cramping with vaginal discharge EXAM: TRANSABDOMINAL AND TRANSVAGINAL ULTRASOUND OF PELVIS DOPPLER ULTRASOUND OF OVARIES TECHNIQUE: Study was performed transabdominally to optimize pelvic field of view evaluation and transvaginally to optimize internal visceral architecture evaluation. Color and duplex Doppler ultrasound was utilized to evaluate blood flow to the ovaries. COMPARISON:  None. FINDINGS: Uterus Measurements: 7.3 x  3.8 x 4.2 cm = volume: 61.2 mL. No fibroids or other mass visualized. Endometrium Thickness: 4 mm.  No focal abnormality visualized. Right ovary Measurements: 3.2 x 2.3 x 2.6 cm = volume: 10.2 mL. Normal appearance/no adnexal mass. Left ovary Measurements: 2.9 x 2.0 x 1.9 cm = volume: 5.6 mL. Normal appearance/no adnexal mass. Pulsed Doppler evaluation of both ovaries demonstrates normal low-resistance arterial and venous waveforms. Other findings Trace fluid in cul-de-sac. IMPRESSION: Patient is tender over the right ovary with transvaginal technique. Significance of this tenderness is uncertain. No ovarian/adnexal mass evident on either side. Low resistance waveform in each ovary. No demonstrable ovarian torsion. Uterus and endometrium appear unremarkable. Trace free pelvic fluid may be physiologic. Electronically Signed   By: Lowella Grip III M.D.   On: 07/04/2019 13:03   CT Abdomen Pelvis W Contrast  Result Date: 07/04/2019 CLINICAL DATA:  Vaginal discharge, lower abdominal pain, recently treated for PID EXAM: CT ABDOMEN AND PELVIS WITH CONTRAST TECHNIQUE: Multidetector CT imaging of the abdomen and pelvis was performed using the standard protocol following bolus administration of intravenous contrast. CONTRAST:  19mL OMNIPAQUE IOHEXOL 300 MG/ML  SOLN COMPARISON:  Pelvic ultrasound, 07/04/2019 FINDINGS: Lower chest: No acute abnormality. Hepatobiliary: No solid liver abnormality is seen. No gallstones, gallbladder wall thickening, or biliary dilatation. Pancreas: Unremarkable. No pancreatic ductal dilatation or surrounding inflammatory changes. Spleen: Normal in size without significant abnormality. Adrenals/Urinary Tract: Adrenal glands are unremarkable. Kidneys are normal, without renal calculi, solid  lesion, or hydronephrosis. Bladder is unremarkable. Stomach/Bowel: Stomach is within normal limits. Appendix appears normal. No evidence of bowel wall thickening, distention, or inflammatory changes.  Vascular/Lymphatic: No significant vascular findings are present. No enlarged abdominal or pelvic lymph nodes. Reproductive: No mass or other significant abnormality. Other: No abdominal wall hernia or abnormality. Trace ascites in the low pelvis. Musculoskeletal: No acute or significant osseous findings. IMPRESSION: Nonspecific trace ascites in the low pelvis, as seen on same-day ultrasound, and may be functional or reactive. No specific findings of the abdomen or pelvis to explain lower abdominal pain. Electronically Signed   By: Lauralyn Primes M.D.   On: 07/04/2019 14:42   [place] Art/Ven Flow Abd Pelv Doppler  Result Date: 07/04/2019 CLINICAL DATA:  Pelvic pain and cramping with vaginal discharge EXAM: TRANSABDOMINAL AND TRANSVAGINAL ULTRASOUND OF PELVIS DOPPLER ULTRASOUND OF OVARIES TECHNIQUE: Study was performed transabdominally to optimize pelvic field of view evaluation and transvaginally to optimize internal visceral architecture evaluation. Color and duplex Doppler ultrasound was utilized to evaluate blood flow to the ovaries. COMPARISON:  None. FINDINGS: Uterus Measurements: 7.3 x 3.8 x 4.2 cm = volume: 61.2 mL. No fibroids or other mass visualized. Endometrium Thickness: 4 mm.  No focal abnormality visualized. Right ovary Measurements: 3.2 x 2.3 x 2.6 cm = volume: 10.2 mL. Normal appearance/no adnexal mass. Left ovary Measurements: 2.9 x 2.0 x 1.9 cm = volume: 5.6 mL. Normal appearance/no adnexal mass. Pulsed Doppler evaluation of both ovaries demonstrates normal low-resistance arterial and venous waveforms. Other findings Trace fluid in cul-de-sac. IMPRESSION: Patient is tender over the right ovary with transvaginal technique. Significance of this tenderness is uncertain. No ovarian/adnexal mass evident on either side. Low resistance waveform in each ovary. No demonstrable ovarian torsion. Uterus and endometrium appear unremarkable. Trace free pelvic fluid may be physiologic. Electronically Signed   By:  Bretta Bang III M.D.   On: 07/04/2019 13:03    Assessment & Plan:  1. Pelvic inflammatory disease - On second course of ABX - Continue Doxy and Flagyl  2. History of chlamydia infection - See above  3. Birth control counseling - Patient has been counseled on birth control options today. We discussed risks and benefits of each available contraception, including but not limited to: IUD, Nexplanon, Depo Shot, Nuvaring, OCPs, and condoms/diaphragms. Counseled on risk for STDs not reduced by birth control use.  Information pamphlet given, will decide at her next visit.  Routine preventative health maintenance measures emphasized. Please refer to After Visit Summary for other counseling recommendations.   Return in about 1 week (around 07/13/2019) for Follow up PID, birth control.  Total face-to-face time with patient: 15 minutes. Over 50% of encounter was spent on counseling and coordination of care.  Marlowe Alt, DO OB Fellow, [facility] 07/06/2019 9:17 AM

## 2019-07-20 ENCOUNTER — Encounter: Payer: Self-pay | Admitting: Family Medicine

## 2019-07-20 ENCOUNTER — Other Ambulatory Visit (HOSPITAL_COMMUNITY)
Admission: RE | Admit: 2019-07-20 | Discharge: 2019-07-20 | Disposition: A | Discharge status: Home / Self Care | Payer: Medicaid Other | Source: Ambulatory Visit | Attending: Family Medicine | Admitting: Family Medicine

## 2019-07-20 ENCOUNTER — Other Ambulatory Visit: Payer: Self-pay

## 2019-07-20 ENCOUNTER — Ambulatory Visit (INDEPENDENT_AMBULATORY_CARE_PROVIDER_SITE_OTHER): Payer: Medicaid Other | Admitting: Family Medicine

## 2019-07-20 VITALS — BP 91/62 | HR 74 | Ht 72.0 in | Wt 306.5 lb

## 2019-07-20 DIAGNOSIS — Z3009 Encounter for other general counseling and advice on contraception: Secondary | ICD-10-CM

## 2019-07-20 DIAGNOSIS — Z113 Encounter for screening for infections with a predominantly sexual mode of transmission: Secondary | ICD-10-CM

## 2019-07-20 DIAGNOSIS — Z8742 Personal history of other diseases of the female genital tract: Secondary | ICD-10-CM | POA: Insufficient documentation

## 2019-07-20 DIAGNOSIS — B373 Candidiasis of vulva and vagina: Secondary | ICD-10-CM | POA: Diagnosis not present

## 2019-07-20 DIAGNOSIS — B3731 Acute candidiasis of vulva and vagina: Secondary | ICD-10-CM

## 2019-07-20 MED ORDER — FLUCONAZOLE 150 MG PO TABS: 150.0000 mg | ORAL_TABLET | ORAL | 0 refills | Status: AC

## 2019-07-20 NOTE — Patient Instructions (Signed)
Vaginal Yeast Infection, Adult  Vaginal yeast infection is a condition that causes vaginal discharge as well as soreness, swelling, and redness (inflammation) of the vagina. This is a common condition. Some women get this infection frequently. What are the causes? This condition is caused by a change in the normal balance of the yeast (candida) and bacteria that live in the vagina. This change causes an overgrowth of yeast, which causes the inflammation. What increases the risk? The condition is more likely to develop in women who:  Take antibiotic medicines.  Have diabetes.  Take birth control pills.  Are pregnant.  Douche often.  Have a weak body defense system (immune system).  Have been taking steroid medicines for a long time.  Frequently wear tight clothing. What are the signs or symptoms? Symptoms of this condition include:  White, thick, creamy vaginal discharge.  Swelling, itching, redness, and irritation of the vagina. The lips of the vagina (vulva) may be affected as well.  Pain or a burning feeling while urinating.  Pain during sex. How is this diagnosed? This condition is diagnosed based on:  Your medical history.  A physical exam.  A pelvic exam. Your health care provider will examine a sample of your vaginal discharge under a microscope. Your health care provider may send this sample for testing to confirm the diagnosis. How is this treated? This condition is treated with medicine. Medicines may be over-the-counter or prescription. You may be told to use one or more of the following:  Medicine that is taken by mouth (orally).  Medicine that is applied as a cream (topically).  Medicine that is inserted directly into the vagina (suppository). Follow these instructions at home:  Lifestyle  Do not have sex until your health care provider approves. Tell your sex partner that you have a yeast infection. That person should go to his or her health care  provider and ask if they should also be treated.  Do not wear tight clothes, such as pantyhose or tight pants.  Wear breathable cotton underwear. General instructions  Take or apply over-the-counter and prescription medicines only as told by your health care provider.  Eat more yogurt. This may help to keep your yeast infection from returning.  Do not use tampons until your health care provider approves.  Try taking a sitz bath to help with discomfort. This is a warm water bath that is taken while you are sitting down. The water should only come up to your hips and should cover your buttocks. Do this 3-4 times per day or as told by your health care provider.  Do not douche.  If you have diabetes, keep your blood sugar levels under control.  Keep all follow-up visits as told by your health care provider. This is important. Contact a health care provider if:  You have a fever.  Your symptoms go away and then return.  Your symptoms do not get better with treatment.  Your symptoms get worse.  You have new symptoms.  You develop blisters in or around your vagina.  You have blood coming from your vagina and it is not your menstrual period.  You develop pain in your abdomen. Summary  Vaginal yeast infection is a condition that causes discharge as well as soreness, swelling, and redness (inflammation) of the vagina.  This condition is treated with medicine. Medicines may be over-the-counter or prescription.  Take or apply over-the-counter and prescription medicines only as told by your health care provider.  Do not douche.   Do not have sex or use tampons until your health care provider approves.  Contact a health care provider if your symptoms do not get better with treatment or your symptoms go away and then return. This information is not intended to replace advice given to you by your health care provider. Make sure you discuss any questions you have with your health care  provider. Document Revised: 10/15/2018 Document Reviewed: 08/03/2017 Elsevier Patient Education  2020 [employer].   Intrauterine Device Information An intrauterine device (IUD) is a medical device that is inserted in the uterus to prevent pregnancy. It is a small, T-shaped device that has one or two nylon strings hanging down from it. The strings hang out of the lower part of the uterus (cervix) to allow for future IUD removal. There are two types of IUDs available:  Hormone IUD. This type of IUD is made of plastic and contains the hormone progestin (synthetic progesterone). A hormone IUD may last 3-5 years.  Copper IUD. This type of IUD has copper wire wrapped around it. A copper IUD may last up to 10 years. How is an IUD inserted? An IUD is inserted through the vagina and placed into the uterus with a minor medical procedure. The exact procedure for IUD insertion may vary among health care providers and hospitals. How does an IUD work? Synthetic progesterone in a hormonal IUD prevents pregnancy by:  Thickening cervical mucus to prevent sperm from entering the uterus.  Thinning the uterine lining to prevent a fertilized egg from being implanted there. Copper in a copper IUD prevents pregnancy by making the uterus and fallopian tubes produce a fluid that kills sperm. What are the advantages of an IUD? Advantages of either type of IUD  It is highly effective in preventing pregnancy.  It is reversible. You can become pregnant shortly after the IUD is removed.  It is low-maintenance and can stay in place for a long time.  There are no estrogen-related side effects.  It can be used when breastfeeding.  It is not associated with weight gain.  It can be inserted right after childbirth, an abortion, or a miscarriage. Advantages of a hormone IUD  If it is inserted within 7 days of your period starting, it works right after it is inserted. If the hormone IUD is inserted at any other  time in your cycle, you will need to use a backup method of birth control for 7 days after insertion.  It can make menstrual periods lighter.  It can reduce menstrual cramping.  It can be used for 3-5 years. Advantages of a copper IUD  It works right after it is inserted.  It can be used as a form of emergency birth control if it is inserted within 5 days after having unprotected sex.  It does not interfere with your body's natural hormones.  It can be used for 10 years. What are the disadvantages of an IUD?  An IUD may cause irregular menstrual bleeding for a period of time after insertion.  You may have pain during insertion and have cramping and vaginal bleeding after insertion.  An IUD may cut the uterus (uterine perforation) when it is inserted. This is rare.  An IUD may cause pelvic inflammatory disease (PID), which is an infection in the uterus and fallopian tubes. This is rare, and it usually happens during the first 20 days after the IUD is inserted.  A copper IUD can make your menstrual flow heavier and more painful. How  is an IUD removed?  You will lie on your back with your knees bent and your feet in footrests (stirrups).  A device will be inserted into your vagina to spread apart the vaginal walls (speculum). This will allow your health care provider to see the strings attached to the IUD.  Your health care provider will use a small instrument (forceps) to grasp the IUD strings and pull firmly until the IUD is removed. You may have some discomfort when the IUD is removed. Your health care provider may recommend taking over-the-counter pain relievers, such as ibuprofen, before the procedure. You may also have minor spotting for a few days after the procedure. The exact procedure for IUD removal may vary among health care providers and hospitals. Is the IUD right for me? Your health care provider will make sure you are a good candidate for an IUD and will discuss the  advantages, disadvantages, and possible side effects with you. Summary  An intrauterine device (IUD) is a medical device that is inserted in the uterus to prevent pregnancy. It is a small, T-shaped device that has one or two nylon strings hanging down from it.  A hormone IUD contains the hormone progestin (synthetic progesterone). A copper IUD has copper wire wrapped around it.  Synthetic progesterone in a hormone IUD prevents pregnancy by thickening cervical mucus and thinning the walls of the uterus. Copper in a copper IUD prevents pregnancy by making the uterus and fallopian tubes produce a fluid that kills sperm.  A hormone IUD can be left in place for 3-5 years. A copper IUD can be left in place for up to 10 years.  An IUD is inserted and removed by a health care provider. You may feel some pain during insertion and removal. Your health care provider may recommend taking over-the-counter pain medicine, such as ibuprofen, before an IUD procedure. This information is not intended to replace advice given to you by your health care provider. Make sure you discuss any questions you have with your health care provider. Document Revised: 02/27/2017 Document Reviewed: 04/15/2016 Elsevier Patient Education  [employer].

## 2019-07-20 NOTE — Progress Notes (Signed)
GYNECOLOGY OFFICE NOTE  History:  19 y.o. G0P0000 here today for follow up of PID.    She originally presented to the [facility] on 3/15 with complaints of 4-5 days of vaginal discharge and odor with abdominal pain. She tested positive for Chlamydia. She was given Doxy and Flagyl, reportedly finished this medication and abstained from intercourse. At the end of March, her vaginal discharge and other symptoms returned and she presented to the ED on 07/04/19. She was given Rocephin, and sent home on another course of Doxy and Flagyl.   CT abdomen and pelvis: Nonspecific trace ascites in the low pelvis, as seen on same-day ultrasound, and may be functional or reactive. No specific findings of the abdomen or pelvis to explain lower abdominal pain.  Pelvic US:  Patient is tender over the right ovary with transvaginal technique. Significance of this tenderness is uncertain. No ovarian/adnexal mass evident on either side. Low resistance waveform in each ovary. No demonstrable ovarian torsion. Uterus and endometrium appear unremarkable. Trace free pelvic fluid may be physiologic.  As her visit on 07/06/19, she was feeling much better. Denied fevers, chills, nausea, vomiting, constipation, flank pain, or dysuria. Her abdominal pain hah resolved- all she had was cramping like her normal period. She was on Day#3 of her menses, which typically lasts 5-7 days.  Today she reports her abdominal pain has resolved, however she has had itching in her vagina and has noticed a "dry" feeling "down there."  Past Medical History:  Diagnosis Date   Migraine     Past Surgical History:  Procedure Laterality Date   AXILLARY ABCESS IRRIGATION AND DEBRIDEMENT       Current Outpatient Medications:    acetaminophen (TYLENOL) 500 MG tablet, Take 1,000 mg by mouth every 6 (six) hours as needed for mild pain or headache., Disp: , Rfl:    ibuprofen (ADVIL,MOTRIN) 200 MG tablet, Take 800 mg by mouth every 6 (six) hours  as needed for headache or moderate pain., Disp: , Rfl:    rizatriptan (MAXALT) 10 MG tablet, Take 10 mg by mouth as needed for migraine. May repeat in 2 hours if needed, Disp: , Rfl:    fluconazole (DIFLUCAN) 150 MG tablet, Take 1 tablet (150 mg total) by mouth every 3 (three) days for 2 doses., Disp: 2 tablet, Rfl: 0   JUNEL FE 1/20 1-20 MG-MCG tablet, Take 1 tablet by mouth daily., Disp: , Rfl: 2   Multiple Vitamins-Minerals (WOMENS MULTIVITAMIN PO), Take by mouth., Disp: , Rfl:   The following portions of the patient's history were reviewed and updated as appropriate: allergies, current medications, past family history, past medical history, past social history, past surgical history and problem list.   Review of Systems:  Pertinent items noted in HPI and remainder of comprehensive ROS otherwise negative.   Objective:  Physical Exam BP 91/62    Pulse 74    Ht 6' (1.829 m)    Wt (!) 306 lb 8 oz (139 kg)    LMP 07/04/2019 (Exact Date)    BMI 41.57 kg/m  CONSTITUTIONAL: Well-developed, well-nourished female in no acute distress.  HENT:  Normocephalic, atraumatic. External right and left ear normal. Oropharynx is clear and moist EYES: Conjunctivae and EOM are normal. Pupils are equal, round, and reactive to light. No scleral icterus.  NECK: Normal range of motion, supple, no masses SKIN: Skin is warm and dry. No rash noted. Not diaphoretic. No erythema. No pallor. NEUROLOGIC: Alert and oriented to person, place, and time.  Normal reflexes, muscle tone coordination. No cranial nerve deficit noted. PSYCHIATRIC: Normal mood and affect. Normal behavior. Normal judgment and thought content. CARDIOVASCULAR: Normal heart rate noted RESPIRATORY: Effort and breath sounds normal, no problems with respiration noted ABDOMEN: Soft, no distention noted.   PELVIC: Normal appearing external genitalia; normal appearing vaginal mucosa and cervix.  White, thick, discharge noted.  Pelvic cultures obtained.  Patient uncomfortable during speculum exam. MUSCULOSKELETAL: Normal range of motion. No edema noted.  Exam done with chaperone present.  Labs and Imaging US Transvaginal Non-OB  Result Date: 07/04/2019 CLINICAL DATA:  Pelvic pain and cramping with vaginal discharge EXAM: TRANSABDOMINAL AND TRANSVAGINAL ULTRASOUND OF PELVIS DOPPLER ULTRASOUND OF OVARIES TECHNIQUE: Study was performed transabdominally to optimize pelvic field of view evaluation and transvaginally to optimize internal visceral architecture evaluation. Color and duplex Doppler ultrasound was utilized to evaluate blood flow to the ovaries. COMPARISON:  None. FINDINGS: Uterus Measurements: 7.3 x 3.8 x 4.2 cm = volume: 61.2 mL. No fibroids or other mass visualized. Endometrium Thickness: 4 mm.  No focal abnormality visualized. Right ovary Measurements: 3.2 x 2.3 x 2.6 cm = volume: 10.2 mL. Normal appearance/no adnexal mass. Left ovary Measurements: 2.9 x 2.0 x 1.9 cm = volume: 5.6 mL. Normal appearance/no adnexal mass. Pulsed Doppler evaluation of both ovaries demonstrates normal low-resistance arterial and venous waveforms. Other findings Trace fluid in cul-de-sac. IMPRESSION: Patient is tender over the right ovary with transvaginal technique. Significance of this tenderness is uncertain. No ovarian/adnexal mass evident on either side. Low resistance waveform in each ovary. No demonstrable ovarian torsion. Uterus and endometrium appear unremarkable. Trace free pelvic fluid may be physiologic. Electronically Signed   By: Bretta Bang III M.D.   On: 07/04/2019 13:03   US Pelvis Complete  Result Date: 07/04/2019 CLINICAL DATA:  Pelvic pain and cramping with vaginal discharge EXAM: TRANSABDOMINAL AND TRANSVAGINAL ULTRASOUND OF PELVIS DOPPLER ULTRASOUND OF OVARIES TECHNIQUE: Study was performed transabdominally to optimize pelvic field of view evaluation and transvaginally to optimize internal visceral architecture evaluation. Color and duplex  Doppler ultrasound was utilized to evaluate blood flow to the ovaries. COMPARISON:  None. FINDINGS: Uterus Measurements: 7.3 x 3.8 x 4.2 cm = volume: 61.2 mL. No fibroids or other mass visualized. Endometrium Thickness: 4 mm.  No focal abnormality visualized. Right ovary Measurements: 3.2 x 2.3 x 2.6 cm = volume: 10.2 mL. Normal appearance/no adnexal mass. Left ovary Measurements: 2.9 x 2.0 x 1.9 cm = volume: 5.6 mL. Normal appearance/no adnexal mass. Pulsed Doppler evaluation of both ovaries demonstrates normal low-resistance arterial and venous waveforms. Other findings Trace fluid in cul-de-sac. IMPRESSION: Patient is tender over the right ovary with transvaginal technique. Significance of this tenderness is uncertain. No ovarian/adnexal mass evident on either side. Low resistance waveform in each ovary. No demonstrable ovarian torsion. Uterus and endometrium appear unremarkable. Trace free pelvic fluid may be physiologic. Electronically Signed   By: Bretta Bang III M.D.   On: 07/04/2019 13:03   CT Abdomen Pelvis W Contrast  Result Date: 07/04/2019 CLINICAL DATA:  Vaginal discharge, lower abdominal pain, recently treated for PID EXAM: CT ABDOMEN AND PELVIS WITH CONTRAST TECHNIQUE: Multidetector CT imaging of the abdomen and pelvis was performed using the standard protocol following bolus administration of intravenous contrast. CONTRAST:  OMNIPAQUE IOHEXOL 300 MG/ML  SOLN COMPARISON:  Pelvic ultrasound, 07/04/2019 FINDINGS: Lower chest: No acute abnormality. Hepatobiliary: No solid liver abnormality is seen. No gallstones, gallbladder wall thickening, or biliary dilatation. Pancreas: Unremarkable. No pancreatic ductal dilatation or surrounding  inflammatory changes. Spleen: Normal in size without significant abnormality. Adrenals/Urinary Tract: Adrenal glands are unremarkable. Kidneys are normal, without renal calculi, solid lesion, or hydronephrosis. Bladder is unremarkable. Stomach/Bowel: Stomach  is within normal limits. Appendix appears normal. No evidence of bowel wall thickening, distention, or inflammatory changes. Vascular/Lymphatic: No significant vascular findings are present. No enlarged abdominal or pelvic lymph nodes. Reproductive: No mass or other significant abnormality. Other: No abdominal wall hernia or abnormality. Trace ascites in the low pelvis. Musculoskeletal: No acute or significant osseous findings. IMPRESSION: Nonspecific trace ascites in the low pelvis, as seen on same-day ultrasound, and may be functional or reactive. No specific findings of the abdomen or pelvis to explain lower abdominal pain. Electronically Signed   By: Lauralyn Primes M.D.   On: 07/04/2019 14:42   [place] Art/Ven Flow Abd Pelv Doppler  Result Date: 07/04/2019 CLINICAL DATA:  Pelvic pain and cramping with vaginal discharge EXAM: TRANSABDOMINAL AND TRANSVAGINAL ULTRASOUND OF PELVIS DOPPLER ULTRASOUND OF OVARIES TECHNIQUE: Study was performed transabdominally to optimize pelvic field of view evaluation and transvaginally to optimize internal visceral architecture evaluation. Color and duplex Doppler ultrasound was utilized to evaluate blood flow to the ovaries. COMPARISON:  None. FINDINGS: Uterus Measurements: 7.3 x 3.8 x 4.2 cm = volume: 61.2 mL. No fibroids or other mass visualized. Endometrium Thickness: 4 mm.  No focal abnormality visualized. Right ovary Measurements: 3.2 x 2.3 x 2.6 cm = volume: 10.2 mL. Normal appearance/no adnexal mass. Left ovary Measurements: 2.9 x 2.0 x 1.9 cm = volume: 5.6 mL. Normal appearance/no adnexal mass. Pulsed Doppler evaluation of both ovaries demonstrates normal low-resistance arterial and venous waveforms. Other findings Trace fluid in cul-de-sac. IMPRESSION: Patient is tender over the right ovary with transvaginal technique. Significance of this tenderness is uncertain. No ovarian/adnexal mass evident on either side. Low resistance waveform in each ovary. No demonstrable ovarian  torsion. Uterus and endometrium appear unremarkable. Trace free pelvic fluid may be physiologic. Electronically Signed   By: Bretta Bang III M.D.   On: 07/04/2019 13:03    Assessment & Plan:  1. History of PID - s/p two courses ABX - pelvic culture obtained - return precautions discussed  2. Screening for STD (sexually transmitted disease) - Cervicovaginal ancillary only( [place])  3. Yeast infection of the vagina - fluconazole (DIFLUCAN) 150 MG tablet; Take 1 tablet (150 mg total) by mouth every 3 (three) days for 2 doses.  Dispense: 2 tablet; Refill: 0  4. Birth control counseling - Does not want hormonal options as she has been losing weight and does not wish to gain any more - Discussed Paragard IUD, patient is interested in this, will need to wait for 90 days as she had PID- will schedule for August - She will call [place] if she needs another form of birth control in the mean time  Routine preventative health maintenance measures emphasized. Please refer to After Visit Summary for other counseling recommendations.   Return for August, paragard IUD insertion, annual.  Total face-to-face time with patient: 15 minutes. Over 50% of encounter was spent on counseling and coordination of care.  Marlowe Alt, DO OB Fellow, [facility] 07/20/2019 2:04 PM

## 2019-07-21 LAB — CERVICOVAGINAL ANCILLARY ONLY
Bacterial Vaginitis (gardnerella): NEGATIVE
Candida Glabrata: NEGATIVE
Candida Vaginitis: POSITIVE — AB
Chlamydia: NEGATIVE
Comment: NEGATIVE
Comment: NEGATIVE
Comment: NEGATIVE
Comment: NEGATIVE
Comment: NEGATIVE
Comment: NORMAL
Neisseria Gonorrhea: NEGATIVE
Trichomonas: NEGATIVE

## 2019-07-25 ENCOUNTER — Telehealth: Payer: Self-pay

## 2019-07-25 NOTE — Telephone Encounter (Signed)
Called pt to make sure she was aware of her + Yeast test results. Pt states yes she is aware & she already picked up Meds.

## 2019-07-25 NOTE — Telephone Encounter (Signed)
Provider aware, thank you

## 2019-07-25 NOTE — Telephone Encounter (Signed)
-----   Message from Marlowe Alt, DO sent at 07/21/2019  1:21 PM EDT ----- + for yeast, already sent fluconazole

## 2019-08-19 ENCOUNTER — Ambulatory Visit: Payer: Medicaid Other

## 2019-08-19 ENCOUNTER — Encounter: Payer: Self-pay | Admitting: Family Medicine

## 2021-08-20 IMAGING — CT CT ABD-PELV W/ CM
2 of 4 series · 16 of 46 positions shown, 18 images · IV contrast (omnipaque)
Comparison: Pelvic ultrasound, 07/04/2019

CLINICAL DATA: Vaginal discharge, lower abdominal pain, recently
treated for PID

EXAM:
CT ABDOMEN AND PELVIS WITH CONTRAST
TECHNIQUE: Multidetector CT imaging of the abdomen and pelvis was performed
using the standard protocol following bolus administration of
intravenous contrast.
CONTRAST:  100mL OMNIPAQUE IOHEXOL 300 MG/ML  SOLN

[Series 2: axial st · axial · 0.79mm/px · z∈[-519,-44]mm · 13 of 107 slices shown, 15 images]
[im 6/107  soft-tissue]
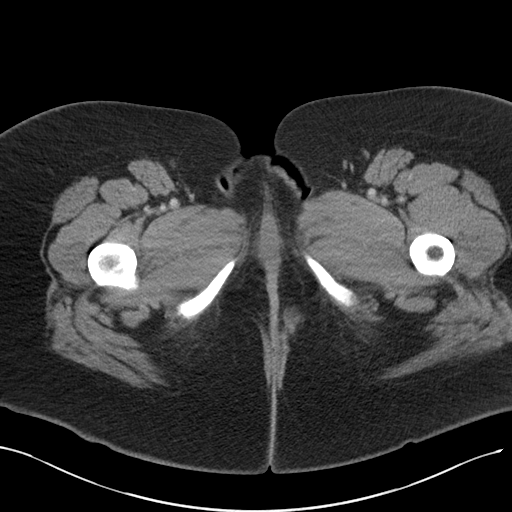
[im 6/107  bone]
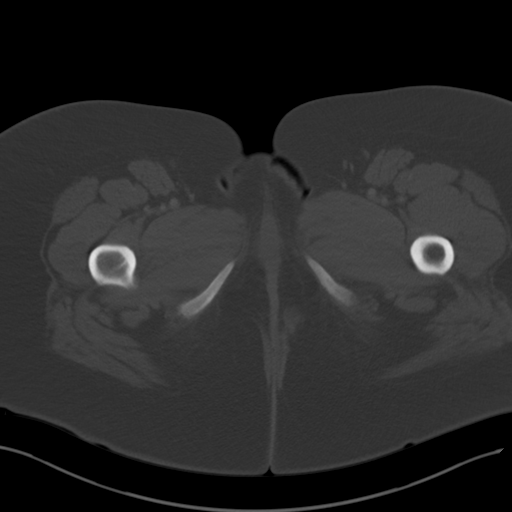
[im 16/107  soft-tissue]
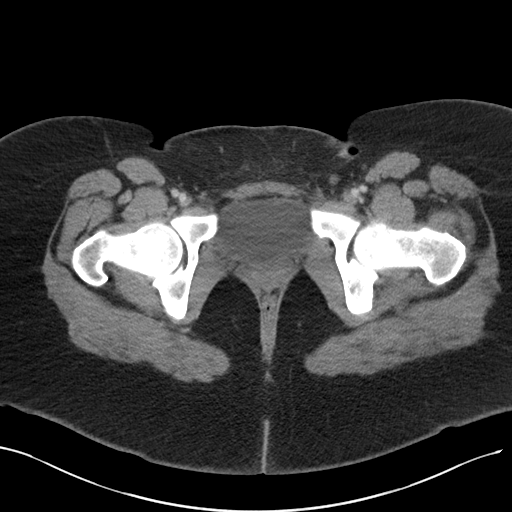
[im 22/107  soft-tissue]
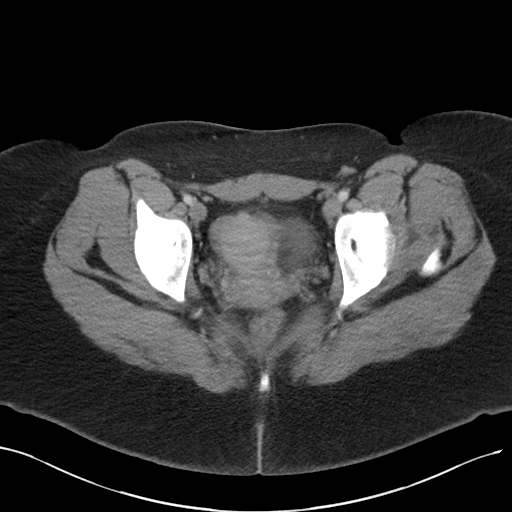
[im 32/107  soft-tissue]
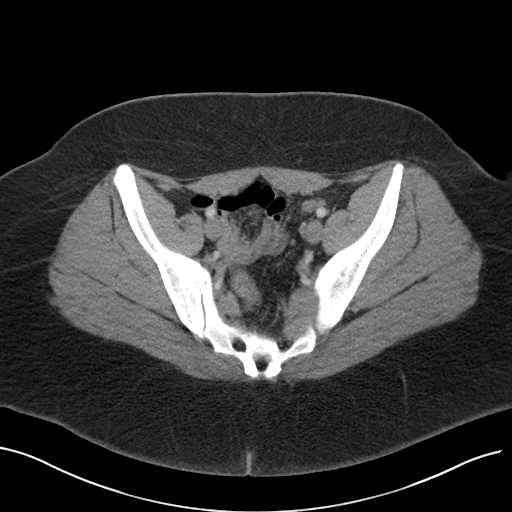
[im 38/107  soft-tissue]
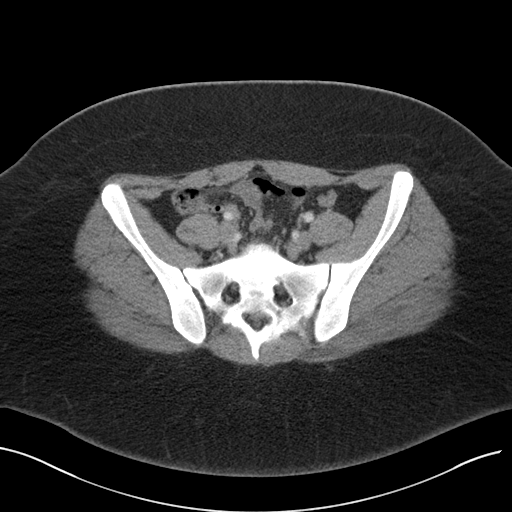
[im 48/107  soft-tissue]
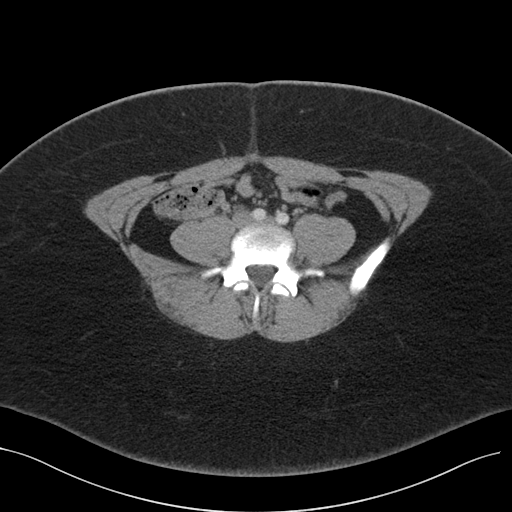
[im 54/107  soft-tissue]
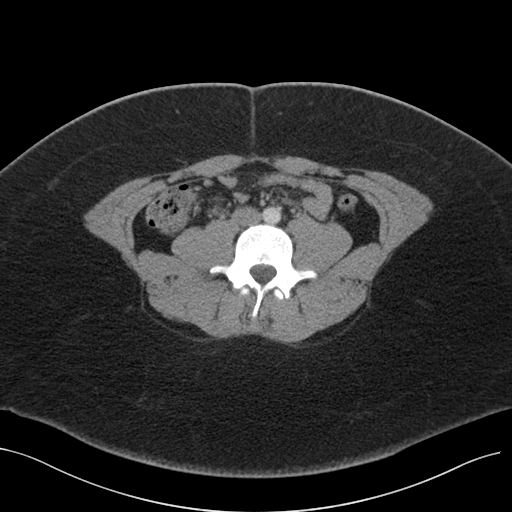
[im 59/107  soft-tissue]
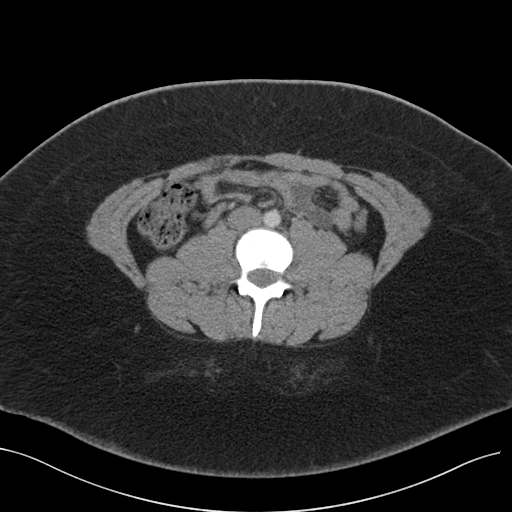
[im 69/107  soft-tissue]
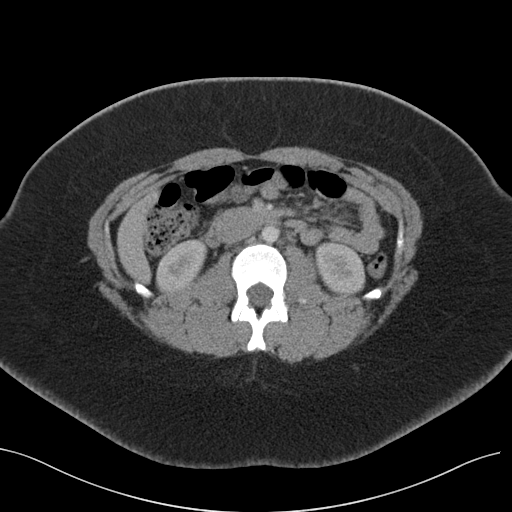
[im 69/107  bone]
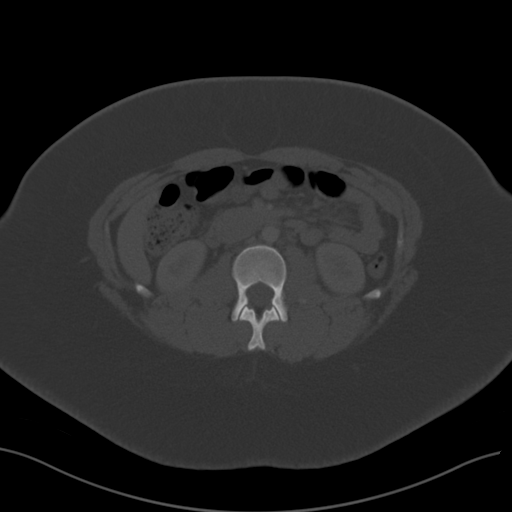
[im 75/107  soft-tissue]
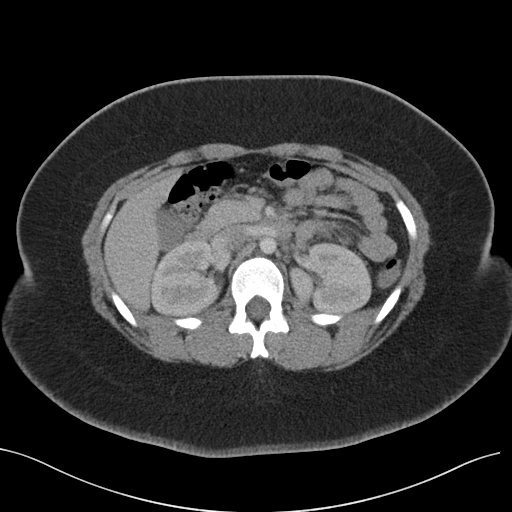
[im 85/107  soft-tissue]
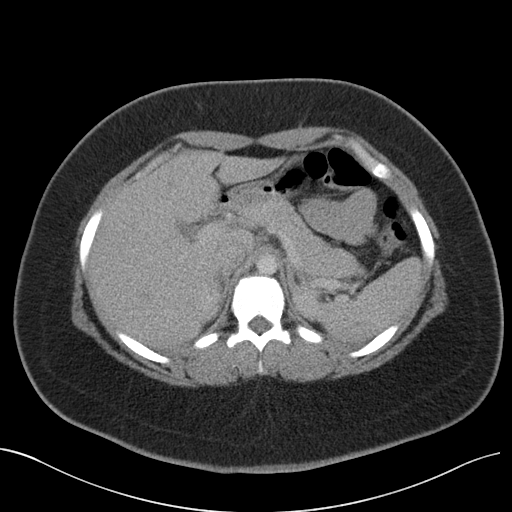
[im 91/107  soft-tissue]
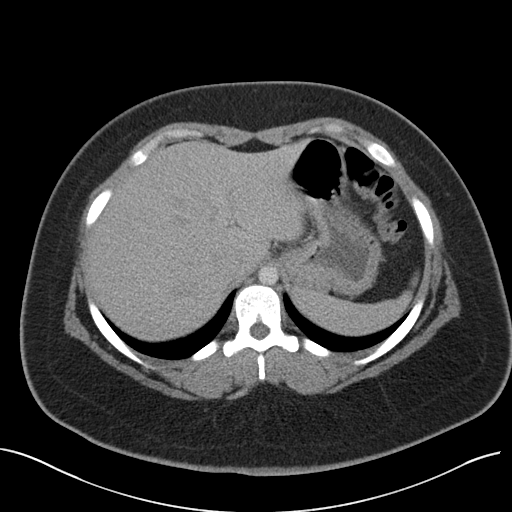
[im 101/107  soft-tissue]
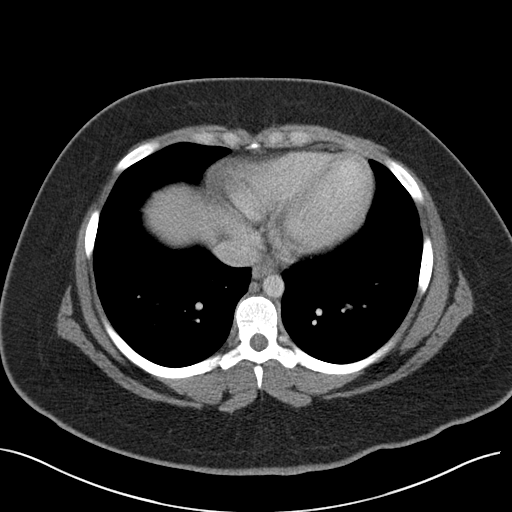

[Series 4: coronal st · coronal · 0.89mm/px · 3 of 154 slices shown]
[im 52/154  soft-tissue]
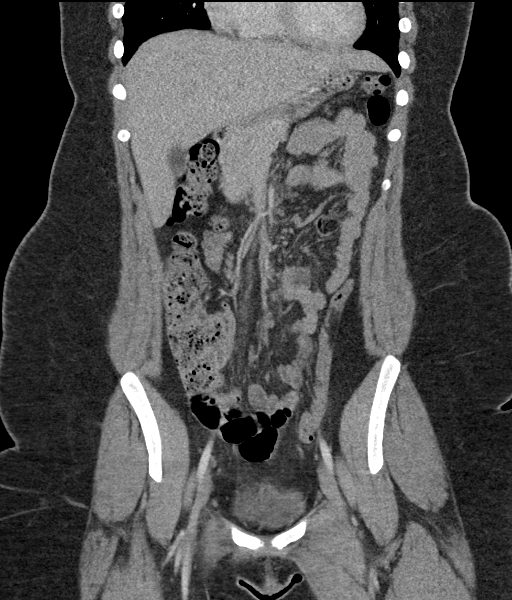
[im 69/154  soft-tissue]
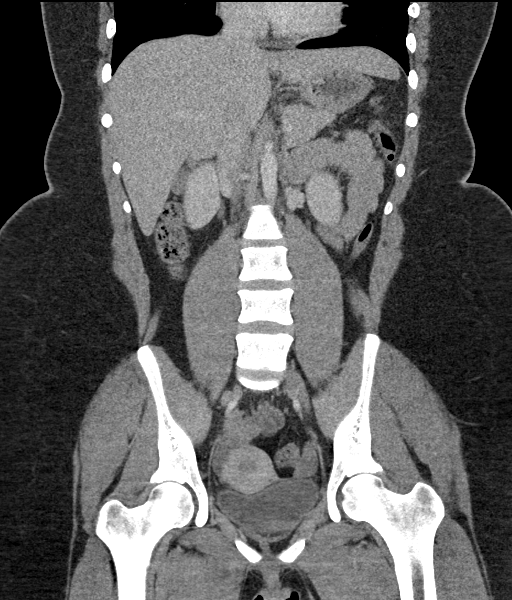
[im 86/154  soft-tissue]
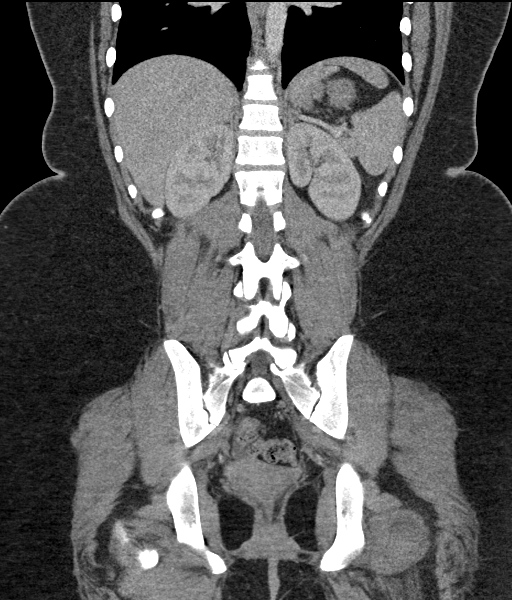

[16 of 46 positions shown; findings below may reference images not displayed]

FINDINGS: Lower chest: No acute abnormality.

Hepatobiliary: No solid liver abnormality is seen. No gallstones,
gallbladder wall thickening, or biliary dilatation.

Pancreas: Unremarkable. No pancreatic ductal dilatation or
surrounding inflammatory changes.

Spleen: Normal in size without significant abnormality.

Adrenals/Urinary Tract: Adrenal glands are unremarkable. Kidneys are
normal, without renal calculi, solid lesion, or hydronephrosis.
Bladder is unremarkable.

Stomach/Bowel: Stomach is within normal limits. Appendix appears
normal. No evidence of bowel wall thickening, distention, or
inflammatory changes.

Vascular/Lymphatic: No significant vascular findings are present. No
enlarged abdominal or pelvic lymph nodes.

Reproductive: No mass or other significant abnormality.

Other: No abdominal wall hernia or abnormality. Trace ascites in the
low pelvis.

Musculoskeletal: No acute or significant osseous findings.
IMPRESSION: Nonspecific trace ascites in the low pelvis, as seen on same-day
ultrasound, and may be functional or reactive. No specific findings
of the abdomen or pelvis to explain lower abdominal pain.

## 2021-08-20 IMAGING — US US ART/VEN ABD/PELV/SCROTUM DOPPLER LTD
1 series · 13 of 25 positions shown · non-contrast
Comparison: None.

CLINICAL DATA: Pelvic pain and cramping with vaginal discharge

EXAM:
TRANSABDOMINAL AND TRANSVAGINAL ULTRASOUND OF PELVIS
DOPPLER ULTRASOUND OF OVARIES
TECHNIQUE: Study was performed transabdominally to optimize pelvic field of
view evaluation and transvaginally to optimize internal visceral
architecture evaluation.
Color and duplex Doppler ultrasound was utilized to evaluate blood
flow to the ovaries.

[Series 1: us art/ven abd/pelv/scrotum doppler ltd · 13 of 83 slices shown]
[im 1/83]
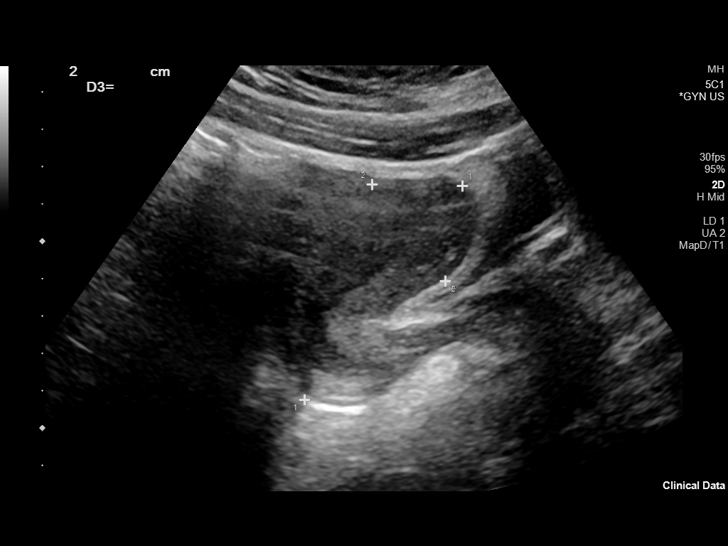
[im 7/83]
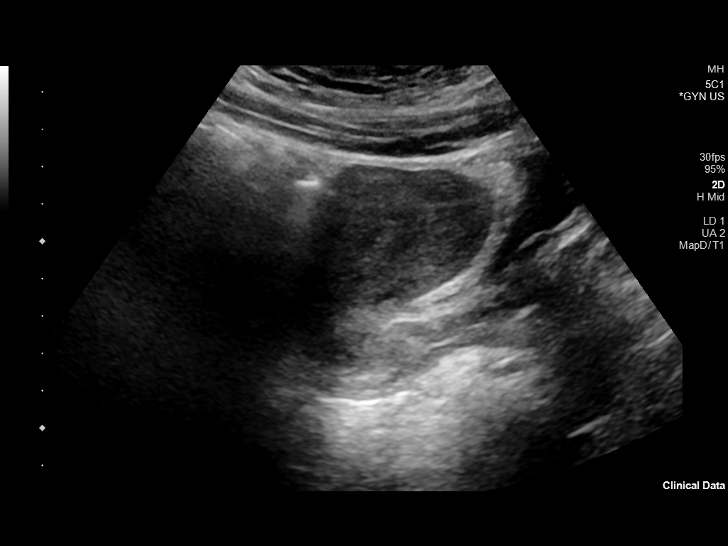
[im 14/83]
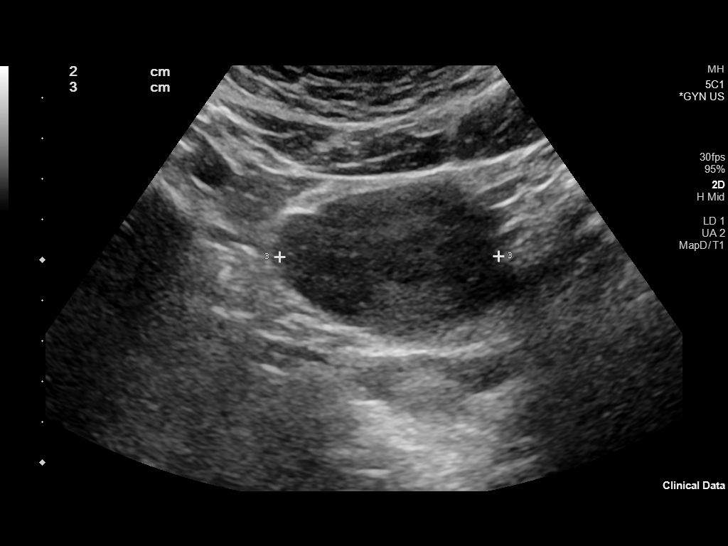
[im 21/83]
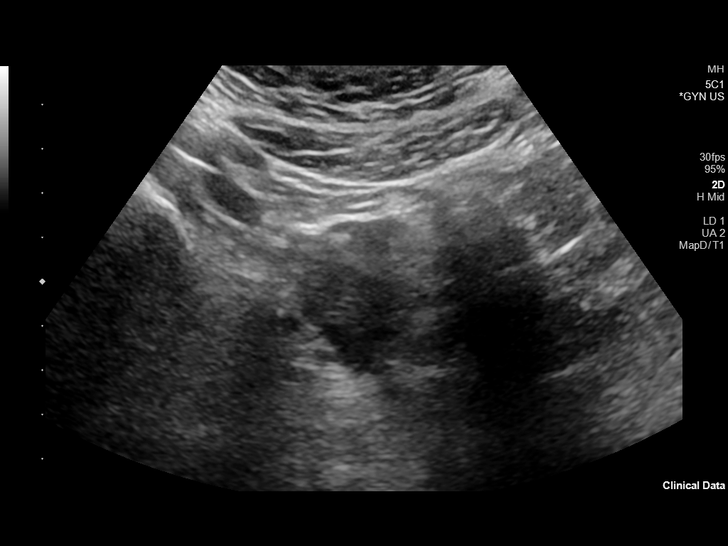
[im 28/83]
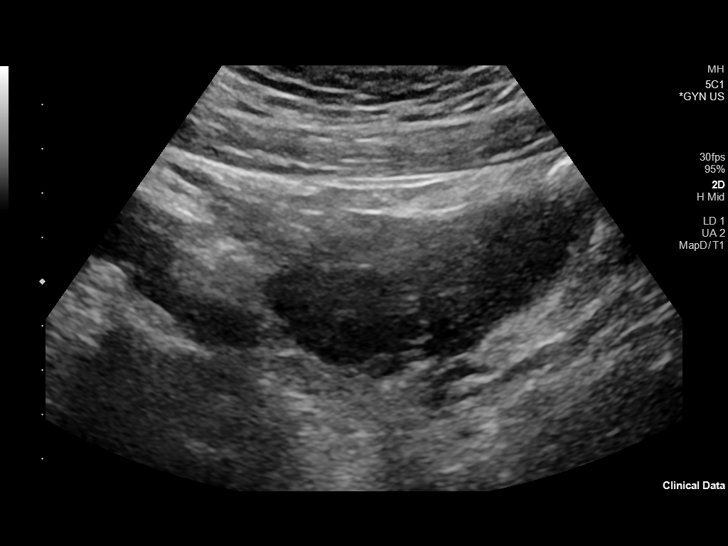
[im 35/83]
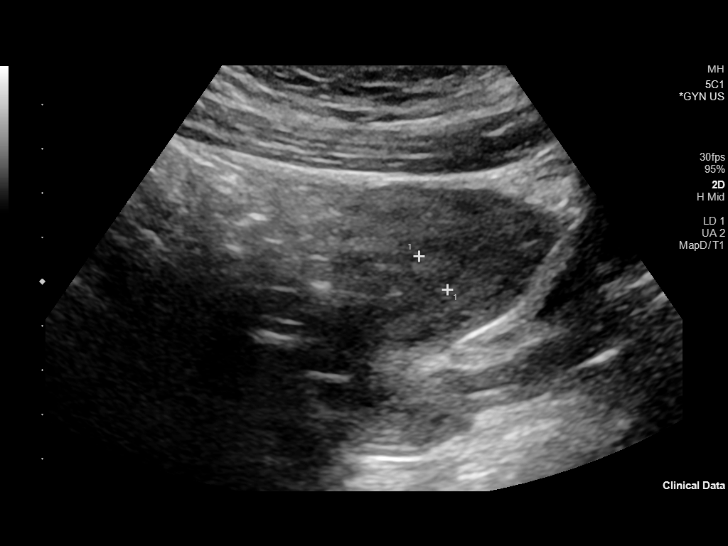
[im 42/83]
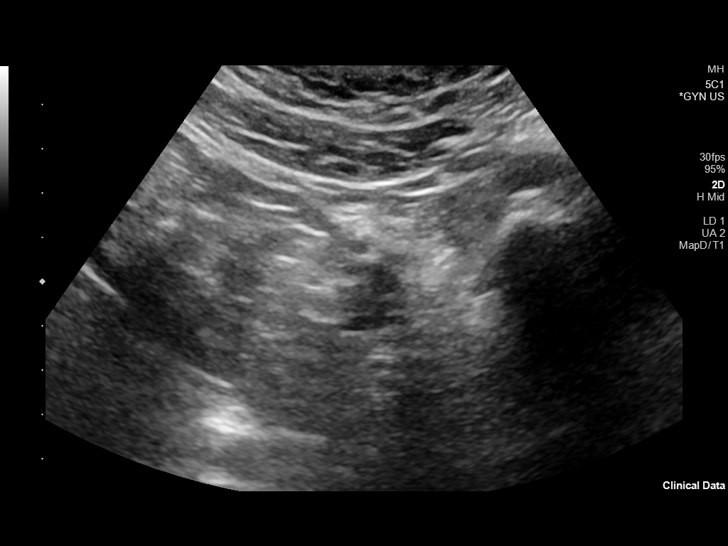
[im 48/83]
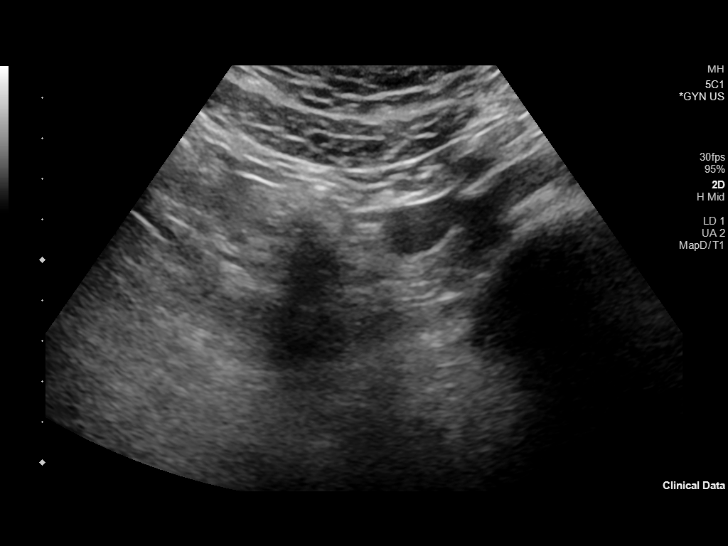
[im 55/83]
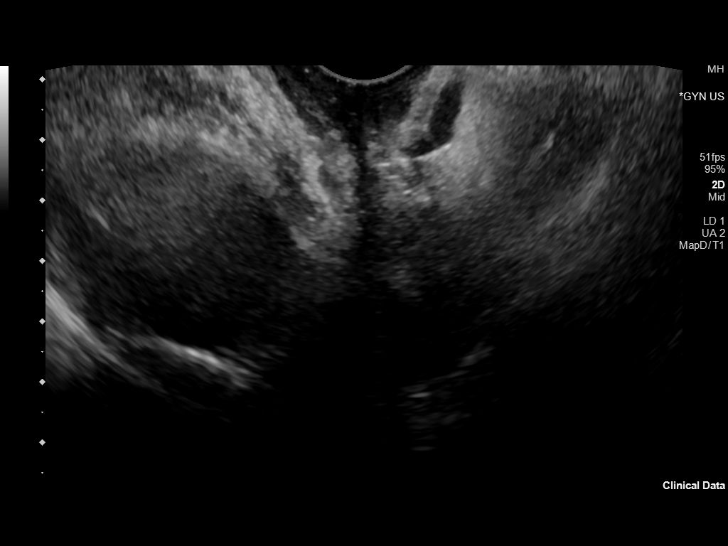
[im 62/83]
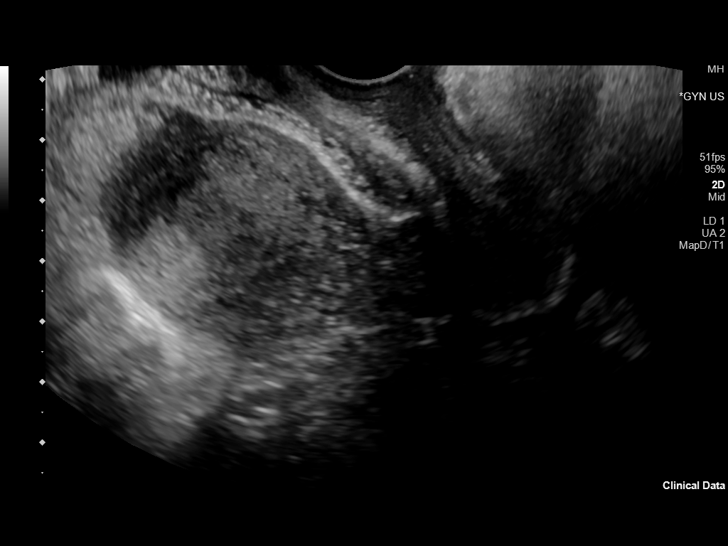
[im 69/83]
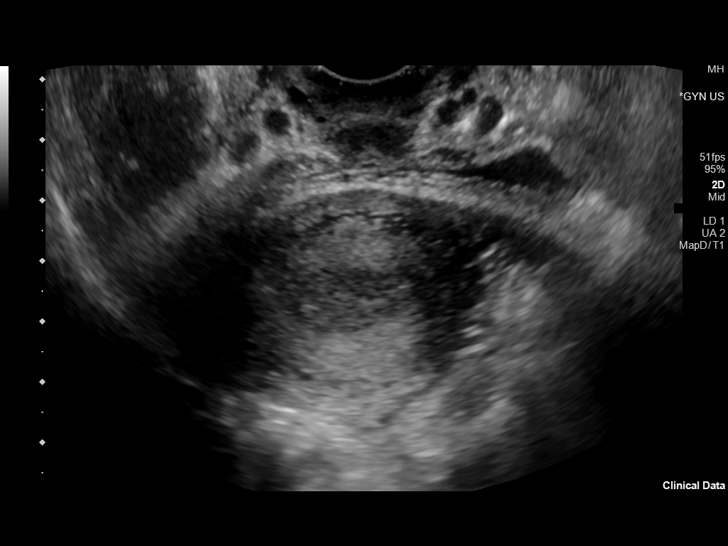
[im 76/83]
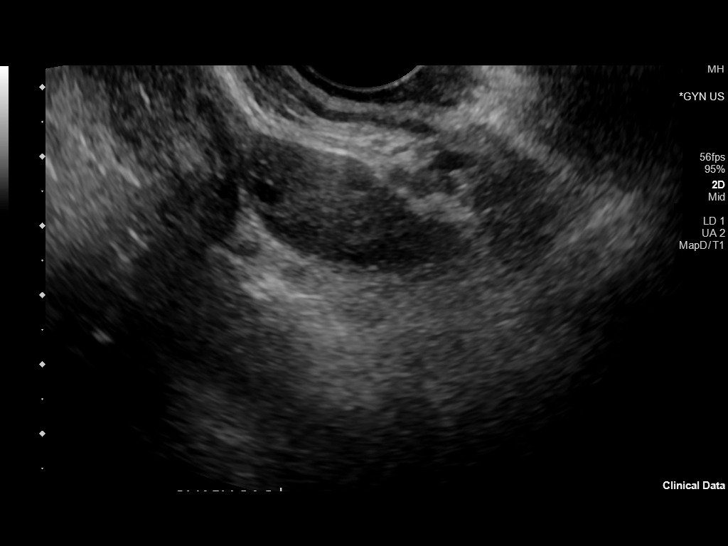
[im 83/83]
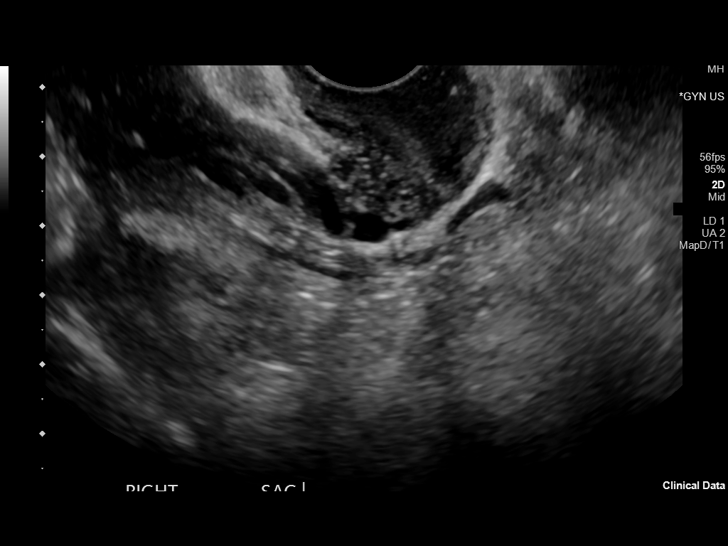

[13 of 25 positions shown; findings below may reference images not displayed]

FINDINGS: Uterus

Measurements: 7.3 x 3.8 x 4.2 cm = volume: 61.2 mL. No fibroids or
other mass visualized.

Endometrium

Thickness: 4 mm.  No focal abnormality visualized.

Right ovary

Measurements: 3.2 x 2.3 x 2.6 cm = volume: 10.2 mL. Normal
appearance/no adnexal mass.

Left ovary

Measurements: 2.9 x 2.0 x 1.9 cm = volume: 5.6 mL. Normal
appearance/no adnexal mass.

Pulsed Doppler evaluation of both ovaries demonstrates normal
low-resistance arterial and venous waveforms.

Other findings

Trace fluid in cul-de-sac.
IMPRESSION: Patient is tender over the right ovary with transvaginal technique.
Significance of this tenderness is uncertain.

No ovarian/adnexal mass evident on either side. Low resistance
waveform in each ovary. No demonstrable ovarian torsion.

Uterus and endometrium appear unremarkable.

Trace free pelvic fluid may be physiologic.

## 2021-12-06 ENCOUNTER — Ambulatory Visit (HOSPITAL_COMMUNITY)
Admission: EM | Admit: 2021-12-06 | Discharge: 2021-12-06 | Disposition: A | Discharge status: Home / Self Care | Payer: Medicaid Other | Attending: Emergency Medicine | Admitting: Emergency Medicine

## 2021-12-06 ENCOUNTER — Encounter (HOSPITAL_COMMUNITY): Payer: Self-pay | Admitting: Emergency Medicine

## 2021-12-06 DIAGNOSIS — N760 Acute vaginitis: Secondary | ICD-10-CM | POA: Insufficient documentation

## 2021-12-06 DIAGNOSIS — Z202 Contact with and (suspected) exposure to infections with a predominantly sexual mode of transmission: Secondary | ICD-10-CM | POA: Diagnosis not present

## 2021-12-06 DIAGNOSIS — Z3202 Encounter for pregnancy test, result negative: Secondary | ICD-10-CM | POA: Diagnosis not present

## 2021-12-06 DIAGNOSIS — N898 Other specified noninflammatory disorders of vagina: Secondary | ICD-10-CM | POA: Diagnosis present

## 2021-12-06 DIAGNOSIS — Z113 Encounter for screening for infections with a predominantly sexual mode of transmission: Secondary | ICD-10-CM | POA: Insufficient documentation

## 2021-12-06 LAB — HIV ANTIBODY (ROUTINE TESTING W REFLEX): HIV Screen 4th Generation wRfx: NONREACTIVE

## 2021-12-06 LAB — POC URINE PREG, ED: Preg Test, Ur: NEGATIVE

## 2021-12-06 NOTE — Discharge Instructions (Signed)
We will call you if anything on your swab or blood work returns positive. Please abstain from sexual intercourse until your results return.  You can follow up with the women's clinic as needed.

## 2021-12-06 NOTE — ED Provider Notes (Signed)
[facility]    CSN: [id number] Arrival date & time: 12/06/21  1058      History   Chief Complaint Chief Complaint  Patient presents with   Vaginal Discharge    HPI Katherine Cross is a 21 y.o. female.  Presents with 1 month history of vaginal discharge. Reports intermittent "mashed potato consistency" discharge. Unprotected intercourse about 2 months ago, no known exposure to STD. Reports the partner removed the condom during intercourse without her consent.  LMP 8/21. Denies any pain with intercourse, urinary symptoms, abdominal pain.  She would like STD testing including blood work  Past Medical History:  Diagnosis Date   Migraine     Patient Active Problem List   Diagnosis Date Noted   History of PID 07/20/2019    Past Surgical History:  Procedure Laterality Date   AXILLARY ABCESS IRRIGATION AND DEBRIDEMENT      OB History     Gravida  0   Para  0   Term  0   Preterm  0   AB  0   Living  0      SAB  0   IAB  0   Ectopic  0   Multiple  0   Live Births  0            Home Medications    Prior to Admission medications   Medication Sig Start Date End Date Taking? Authorizing Provider  acetaminophen (TYLENOL) 500 MG tablet Take 1,000 mg by mouth every 6 (six) hours as needed for mild pain or headache.    [provider]  ibuprofen (ADVIL,MOTRIN) 200 MG tablet Take 800 mg by mouth every 6 (six) hours as needed for headache or moderate pain.    [provider]  JUNEL FE 1/20 1-20 MG-MCG tablet Take 1 tablet by mouth daily. 01/10/18   [provider]  Multiple Vitamins-Minerals (WOMENS MULTIVITAMIN PO) Take by mouth.    [provider]  rizatriptan (MAXALT) 10 MG tablet Take 10 mg by mouth as needed for migraine. May repeat in 2 hours if needed    [provider]    Family History Family History  Problem Relation Age of Onset   Diabetes Father    Diabetes Maternal Grandfather      Social History Social History   Tobacco Use   Smoking status: Never   Smokeless tobacco: Never  Vaping Use   Vaping Use: Never used  Substance Use Topics   Alcohol use: Yes   Drug use: Not Currently     Allergies   Patient has no known allergies.   Review of Systems Review of Systems  Genitourinary:  Positive for vaginal discharge.   Per HPI  Physical Exam Triage Vital Signs ED Triage Vitals  Enc Vitals Group     BP 12/06/21 1132 (!) 142/56     Pulse Rate 12/06/21 1132 71     Resp 12/06/21 1132 18     Temp 12/06/21 1132 98.9 F (37.2 C)     Temp Source 12/06/21 1132 Oral     SpO2 12/06/21 1132 100 %     Weight --      Height --      Head Circumference --      Peak Flow --      Pain Score 12/06/21 1130 0     Pain Loc --      Pain Edu? --      Excl. in GC? --  No data found.  Updated Vital Signs BP (!) 142/56 (BP Location: Right Arm)   Pulse 71   Temp 98.9 F (37.2 C) (Oral)   Resp 18   LMP 11/18/2021   SpO2 100%    Physical Exam Vitals and nursing note reviewed.  Constitutional:      General: She is not in acute distress.    Appearance: Normal appearance.  HENT:     Mouth/Throat:     Pharynx: Oropharynx is clear.  Cardiovascular:     Rate and Rhythm: Normal rate and regular rhythm.     Pulses: Normal pulses.  Pulmonary:     Effort: Pulmonary effort is normal.  Neurological:     Mental Status: She is alert and oriented to person, place, and time.      UC Treatments / Results  Labs (all labs ordered are listed, but only abnormal results are displayed) Labs Reviewed  HIV ANTIBODY (ROUTINE TESTING W REFLEX)  RPR  POC URINE PREG, ED  CERVICOVAGINAL ANCILLARY ONLY    EKG  Radiology No results found.  Procedures Procedures (including critical care time)  Medications Ordered in UC Medications - No data to display  Initial Impression / Assessment and Plan / UC Course  I have reviewed the triage vital signs and the nursing  notes.  Pertinent labs & imaging results that were available during my care of the patient were reviewed by me and considered in my medical decision making (see chart for details).  Vaginal discharge - unknown etiology but could be STD vs yeast/BV Urine pregnancy negative. Cytology swab, RPR, HIV are pending. Provided contact information for women's clinic in the area as needed for follow-up. Return precautions discussed. Patient agrees to plan  Final Clinical Impressions(s) / UC Diagnoses   Final diagnoses:  Vaginal discharge  Screen for STD (sexually transmitted disease)  Possible exposure to STD     Discharge Instructions      We will call you if anything on your swab or blood work returns positive. Please abstain from sexual intercourse until your results return.  You can follow up with the women's clinic as needed.    ED Prescriptions   None    PDMP not reviewed this encounter.   Katherine Cross, Katherine Cross, [place] 12/06/21 1221

## 2021-12-06 NOTE — ED Triage Notes (Signed)
Pt reports for a month having vaginal discharge that will "be normal then go to like mashed potatoes". Unsure if odor. Denies pain.

## 2021-12-07 LAB — RPR: RPR Ser Ql: NONREACTIVE

## 2021-12-09 ENCOUNTER — Telehealth (HOSPITAL_COMMUNITY): Payer: Self-pay | Admitting: Emergency Medicine

## 2021-12-09 LAB — CERVICOVAGINAL ANCILLARY ONLY
Bacterial Vaginitis (gardnerella): POSITIVE — AB
Candida Glabrata: NEGATIVE
Candida Vaginitis: NEGATIVE
Chlamydia: NEGATIVE
Comment: NEGATIVE
Comment: NEGATIVE
Comment: NEGATIVE
Comment: NEGATIVE
Comment: NEGATIVE
Comment: NORMAL
Neisseria Gonorrhea: NEGATIVE
Trichomonas: NEGATIVE

## 2021-12-09 MED ORDER — METRONIDAZOLE 500 MG PO TABS: 500.0000 mg | ORAL_TABLET | Freq: Two times a day (BID) | ORAL | 0 refills | Status: DC

## 2021-12-09 MED ORDER — FLUCONAZOLE 150 MG PO TABS: 150.0000 mg | ORAL_TABLET | Freq: Once | ORAL | 0 refills | Status: AC

## 2022-02-08 ENCOUNTER — Ambulatory Visit
Admission: EM | Admit: 2022-02-08 | Discharge: 2022-02-08 | Disposition: A | Discharge status: Home / Self Care | Payer: Medicaid Other | Attending: Urgent Care | Admitting: Urgent Care

## 2022-02-08 DIAGNOSIS — N898 Other specified noninflammatory disorders of vagina: Secondary | ICD-10-CM | POA: Insufficient documentation

## 2022-02-08 DIAGNOSIS — B9689 Other specified bacterial agents as the cause of diseases classified elsewhere: Secondary | ICD-10-CM | POA: Diagnosis present

## 2022-02-08 DIAGNOSIS — N760 Acute vaginitis: Secondary | ICD-10-CM | POA: Insufficient documentation

## 2022-02-08 LAB — POCT URINE PREGNANCY: Preg Test, Ur: NEGATIVE

## 2022-02-08 MED ORDER — METRONIDAZOLE 500 MG PO TABS: 500.0000 mg | ORAL_TABLET | Freq: Two times a day (BID) | ORAL | 0 refills | Status: AC

## 2022-02-08 MED ORDER — FLUCONAZOLE 150 MG PO TABS: 150.0000 mg | ORAL_TABLET | ORAL | 0 refills | Status: AC

## 2022-02-08 NOTE — ED Triage Notes (Signed)
Pt c/o vaginal discharge x 1 week-worse after using monostat suppositories-NAD-steady gait

## 2022-02-08 NOTE — ED Provider Notes (Signed)
[facility]  Note:  This document was prepared using Conservation officer, historic buildings and may include unintentional dictation errors.  MRN: [id number] DOB: 09/09/2000  Subjective:   Katherine Cross is a 21 y.o. female presenting for 1 week history of acute onset persistent vaginal discharge.  Patient used over-the-counter Monistat suppositories and has not helped, in fact she is got worse.  Has a history of yeast and BV infections.  Would like empiric treatment.  She is sexually active, has unprotected sex.  LMP was 1022 but patient would like to make sure there is no pregnancy. Denies fever, n/v, abdominal pain, pelvic pain, rashes, dysuria, urinary frequency, hematuria.    No current facility-administered medications for this encounter.  Current Outpatient Medications:    acetaminophen (TYLENOL) 500 MG tablet, Take 1,000 mg by mouth every 6 (six) hours as needed for mild pain or headache., Disp: , Rfl:    ibuprofen (ADVIL,MOTRIN) 200 MG tablet, Take 800 mg by mouth every 6 (six) hours as needed for headache or moderate pain., Disp: , Rfl:    JUNEL FE 1/20 1-20 MG-MCG tablet, Take 1 tablet by mouth daily., Disp: , Rfl: 2   metroNIDAZOLE (FLAGYL) 500 MG tablet, Take 1 tablet (500 mg total) by mouth 2 (two) times daily., Disp: 14 tablet, Rfl: 0   Multiple Vitamins-Minerals (WOMENS MULTIVITAMIN PO), Take by mouth., Disp: , Rfl:    rizatriptan (MAXALT) 10 MG tablet, Take 10 mg by mouth as needed for migraine. May repeat in 2 hours if needed, Disp: , Rfl:    No Known Allergies  Past Medical History:  Diagnosis Date   Migraine      Past Surgical History:  Procedure Laterality Date   AXILLARY ABCESS IRRIGATION AND DEBRIDEMENT      Family History  Problem Relation Age of Onset   Diabetes Father    Diabetes Maternal Grandfather     Social History   Tobacco Use   Smoking status: Never   Smokeless tobacco: Never  Vaping Use   Vaping Use: Never used   Substance Use Topics   Alcohol use: Yes   Drug use: Not Currently    ROS   Objective:   Vitals: BP 118/78 (BP Location: Right Arm)   Pulse 85   Temp 99.2 F (37.3 C) (Oral)   Resp 16   LMP 01/19/2022   SpO2 98%   Physical Exam Constitutional:      General: She is not in acute distress.    Appearance: Normal appearance. She is well-developed. She is not ill-appearing, toxic-appearing or diaphoretic.  HENT:     Head: Normocephalic and atraumatic.     Nose: Nose normal.     Mouth/Throat:     Mouth: Mucous membranes are moist.  Eyes:     General: No scleral icterus.       Right eye: No discharge.        Left eye: No discharge.     Extraocular Movements: Extraocular movements intact.  Cardiovascular:     Rate and Rhythm: Normal rate.  Pulmonary:     Effort: Pulmonary effort is normal.  Skin:    General: Skin is warm and dry.  Neurological:     General: No focal deficit present.     Mental Status: She is alert and oriented to person, place, and time.  Psychiatric:        Mood and Affect: Mood normal.        Behavior: Behavior normal.  Results for orders placed or performed during the hospital encounter of 02/08/22 (from the past 24 hour(s))  POCT urine pregnancy     Status: None   Collection Time: 02/08/22 11:35 AM  Result Value Ref Range   Preg Test, Ur Negative Negative    Assessment and Plan :   PDMP not reviewed this encounter.  1. Bacterial vaginosis   2. Vaginal discharge     Recommended empiric treatment for recurrent bacterial vaginosis and yeast infection with metronidazole and Flagyl.  Labs pending. Counseled patient on potential for adverse effects with medications prescribed/recommended today, ER and return-to-clinic precautions discussed, patient verbalized understanding.    Wallis Bamberg, PA-C 02/08/22 1142

## 2022-02-10 LAB — CERVICOVAGINAL ANCILLARY ONLY
Bacterial Vaginitis (gardnerella): NEGATIVE
Candida Glabrata: NEGATIVE
Candida Vaginitis: POSITIVE — AB
Chlamydia: NEGATIVE
Comment: NEGATIVE
Comment: NEGATIVE
Comment: NEGATIVE
Comment: NEGATIVE
Comment: NEGATIVE
Comment: NORMAL
Neisseria Gonorrhea: NEGATIVE
Trichomonas: NEGATIVE
